# Patient Record
Sex: Female | Born: 1975 | Race: White | Hispanic: No | Marital: Married | Smoking: Never smoker
Health system: Southern US, Community
[De-identification: ages and names within clinical notes are randomized; demographics above are authoritative.]

## PROBLEM LIST (undated history)

## (undated) DIAGNOSIS — O2620 Pregnancy care for patient with recurrent pregnancy loss, unspecified trimester: Secondary | ICD-10-CM

## (undated) DIAGNOSIS — O039 Complete or unspecified spontaneous abortion without complication: Secondary | ICD-10-CM

## (undated) DIAGNOSIS — N809 Endometriosis, unspecified: Secondary | ICD-10-CM

## (undated) DIAGNOSIS — O009 Unspecified ectopic pregnancy without intrauterine pregnancy: Secondary | ICD-10-CM

## (undated) DIAGNOSIS — N979 Female infertility, unspecified: Secondary | ICD-10-CM

## (undated) HISTORY — DX: Endometriosis, unspecified: N80.9

## (undated) HISTORY — DX: Female infertility, unspecified: N97.9

## (undated) HISTORY — DX: Complete or unspecified spontaneous abortion without complication: O03.9

## (undated) HISTORY — DX: Unspecified ectopic pregnancy without intrauterine pregnancy: O00.90

## (undated) HISTORY — PX: WISDOM TOOTH EXTRACTION: SHX21

## (undated) HISTORY — DX: Pregnancy care for patient with recurrent pregnancy loss, unspecified trimester: O26.20

---

## 2005-09-14 ENCOUNTER — Emergency Department: Payer: Self-pay | Admitting: Emergency Medicine

## 2013-01-27 ENCOUNTER — Encounter: Payer: Self-pay | Admitting: *Deleted

## 2013-01-27 ENCOUNTER — Encounter: Payer: Self-pay | Admitting: Internal Medicine

## 2013-01-27 ENCOUNTER — Ambulatory Visit (INDEPENDENT_AMBULATORY_CARE_PROVIDER_SITE_OTHER): Payer: 59 | Admitting: Internal Medicine

## 2013-01-27 VITALS — BP 130/96 | HR 75 | Temp 98.7°F | Ht 65.0 in | Wt 170.0 lb

## 2013-01-27 DIAGNOSIS — Z319 Encounter for procreative management, unspecified: Secondary | ICD-10-CM | POA: Insufficient documentation

## 2013-01-27 DIAGNOSIS — Z0000 Encounter for general adult medical examination without abnormal findings: Secondary | ICD-10-CM

## 2013-01-27 LAB — CBC WITH DIFFERENTIAL/PLATELET
Basophils Relative: 1.1 % (ref 0.0–3.0)
Eosinophils Relative: 1.7 % (ref 0.0–5.0)
HCT: 41.8 % (ref 36.0–46.0)
Lymphs Abs: 1.4 10*3/uL (ref 0.7–4.0)
MCV: 85.3 fl (ref 78.0–100.0)
Monocytes Absolute: 0.5 10*3/uL (ref 0.1–1.0)
Platelets: 231 10*3/uL (ref 150.0–400.0)
RBC: 4.9 Mil/uL (ref 3.87–5.11)
WBC: 5.9 10*3/uL (ref 4.5–10.5)

## 2013-01-27 LAB — LIPID PANEL: Cholesterol: 175 mg/dL (ref 0–200)

## 2013-01-27 LAB — COMPREHENSIVE METABOLIC PANEL
Albumin: 4.1 g/dL (ref 3.5–5.2)
CO2: 23 mEq/L (ref 19–32)
GFR: 87.97 mL/min (ref >60.00)
Glucose, Bld: 94 mg/dL (ref 70–99)
Potassium: 4.2 mEq/L (ref 3.5–5.1)
Sodium: 138 mEq/L (ref 135–145)
Total Protein: 6.7 g/dL (ref 6.0–8.3)

## 2013-01-27 NOTE — Assessment & Plan Note (Signed)
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 2x pregnancy, resulting in miscarriage. Discussed common causes of infertility. Less likely to be ovulation disorder given regular menses and pregnancy x 2.  No symptoms to suggest endometriosis. Discussed female causes of infertility and testing. Will check general labs today including CBC, CMP, TSH, FSH/LH. Encouraged continued efforts at healthy diet and regular physical activity. Continue avoidance of excessive caffeine and alcohol. Will set up referral to [facility]. Follow up here in 3 months and prn.

## 2013-01-27 NOTE — Progress Notes (Signed)
  Subjective:    Patient ID: Jamie Gordon, female    DOB: [birth year], 37 y.o.   MRN: [id number]  HPI 37YO female presents to establish care. Generally healthy with no chronic illnesses. Concerned today about infertility x2 years. Regular menses, typically ever 26 days. No abdominal or pelvic pain, no menorrhagia or cramping. No h/o PID, pelvic surgery. 2x miscarriages both at 5 weeks. No family h/o fertility issues. Menstration started age 82.  Otherwise feeling well. Active. Follows healthy diet. Non-smoker.   No outpatient encounter prescriptions on file as of 01/27/2013.   No facility-administered encounter medications on file as of 01/27/2013.   BP 130/96  Pulse 75  Temp(Src) 98.7 F (37.1 C) (Oral)  Ht 5\' 5"  (1.651 m)  Wt 170 lb (77.111 kg)  BMI 28.29 kg/m2  SpO2 99%  LMP 01/11/2013  Review of Systems  Constitutional: Negative for fever, chills, appetite change, fatigue and unexpected weight change.  HENT: Negative for ear pain, congestion, sore throat, trouble swallowing, neck pain, voice change and sinus pressure.   Eyes: Negative for visual disturbance.  Respiratory: Negative for cough, shortness of breath, wheezing and stridor.   Cardiovascular: Negative for chest pain, palpitations and leg swelling.  Gastrointestinal: Negative for nausea, vomiting, abdominal pain, diarrhea, constipation, blood in stool, abdominal distention and anal bleeding.  Genitourinary: Negative for dysuria and flank pain.  Musculoskeletal: Negative for myalgias, arthralgias and gait problem.  Skin: Negative for color change and rash.  Neurological: Negative for dizziness and headaches.  Hematological: Negative for adenopathy. Does not bruise/bleed easily.  Psychiatric/Behavioral: Negative for suicidal ideas, sleep disturbance and dysphoric mood. The patient is not nervous/anxious.        Objective:   Physical Exam  Constitutional: She is oriented to person, place, and time. She appears  well-developed and well-nourished. No distress.  HENT:  Head: Normocephalic and atraumatic.  Right Ear: External ear normal.  Left Ear: External ear normal.  Nose: Nose normal.  Mouth/Throat: Oropharynx is clear and moist. No oropharyngeal exudate.  Eyes: Conjunctivae are normal. Pupils are equal, round, and reactive to light. Right eye exhibits no discharge. Left eye exhibits no discharge. No scleral icterus.  Neck: Normal range of motion. Neck supple. No tracheal deviation present. No thyromegaly present.  Cardiovascular: Normal rate, regular rhythm, normal heart sounds and intact distal pulses.  Exam reveals no gallop and no friction rub.   No murmur heard. Pulmonary/Chest: Effort normal and breath sounds normal. No accessory muscle usage. Not tachypneic. No respiratory distress. She has no decreased breath sounds. She has no wheezes. She has no rhonchi. She has no rales. She exhibits no tenderness.  Abdominal: Soft. Bowel sounds are normal. She exhibits no distension and no mass. There is no tenderness. There is no rebound and no guarding.  Musculoskeletal: Normal range of motion. She exhibits no edema and no tenderness.  Lymphadenopathy:    She has no cervical adenopathy.  Neurological: She is alert and oriented to person, place, and time. No cranial nerve deficit. She exhibits normal muscle tone. Coordination normal.  Skin: Skin is warm and dry. No rash noted. She is not diaphoretic. No erythema. No pallor.  Psychiatric: She has a normal mood and affect. Her behavior is normal. Judgment and thought content normal.          Assessment & Plan:

## 2013-02-15 DIAGNOSIS — N96 Recurrent pregnancy loss: Secondary | ICD-10-CM | POA: Insufficient documentation

## 2013-02-19 HISTORY — PX: ECTOPIC PREGNANCY SURGERY: SHX613

## 2013-02-21 ENCOUNTER — Ambulatory Visit: Payer: Self-pay | Admitting: General Practice

## 2013-02-21 ENCOUNTER — Observation Stay: Payer: Self-pay | Admitting: Obstetrics and Gynecology

## 2013-02-21 LAB — CBC WITH DIFFERENTIAL/PLATELET
Basophil #: 0.1 10*3/uL (ref 0.0–0.1)
Basophil %: 1.4 %
Eosinophil %: 1.5 %
HCT: 36.7 % (ref 35.0–47.0)
HGB: 12.8 g/dL (ref 12.0–16.0)
Lymphocyte #: 1.9 10*3/uL (ref 1.0–3.6)
Lymphocyte %: 25.1 %
MCH: 29.5 pg (ref 26.0–34.0)
MCV: 85 fL (ref 80–100)
Monocyte %: 7.6 %
Neutrophil #: 4.9 10*3/uL (ref 1.4–6.5)
RBC: 4.34 10*6/uL (ref 3.80–5.20)
RDW: 12.6 % (ref 11.5–14.5)
WBC: 7.6 10*3/uL (ref 3.6–11.0)

## 2013-02-21 LAB — COMPREHENSIVE METABOLIC PANEL
Albumin: 3.9 g/dL (ref 3.4–5.0)
Alkaline Phosphatase: 76 U/L (ref 50–136)
Anion Gap: 6 — ABNORMAL LOW (ref 7–16)
Bilirubin,Total: 0.4 mg/dL (ref 0.2–1.0)
Calcium, Total: 8.6 mg/dL (ref 8.5–10.1)
Co2: 25 mmol/L (ref 21–32)
Creatinine: 0.68 mg/dL (ref 0.60–1.30)
EGFR (African American): >60
EGFR (Non-African Amer.): >60
Osmolality: 269 (ref 275–301)
Potassium: 3.2 mmol/L — ABNORMAL LOW (ref 3.5–5.1)
SGOT(AST): 23 U/L (ref 15–37)
Sodium: 134 mmol/L — ABNORMAL LOW (ref 136–145)
Total Protein: 7 g/dL (ref 6.4–8.2)

## 2013-02-21 LAB — HCG, QUANTITATIVE, PREGNANCY: Beta Hcg, Quant.: 1716 m[IU]/mL — ABNORMAL HIGH

## 2013-02-23 LAB — PATHOLOGY REPORT

## 2013-07-04 ENCOUNTER — Encounter: Payer: Self-pay | Admitting: Internal Medicine

## 2013-07-04 ENCOUNTER — Ambulatory Visit (INDEPENDENT_AMBULATORY_CARE_PROVIDER_SITE_OTHER): Payer: 59 | Admitting: Internal Medicine

## 2013-07-04 VITALS — BP 118/78 | HR 74 | Temp 98.8°F | Wt 183.0 lb

## 2013-07-04 DIAGNOSIS — F4323 Adjustment disorder with mixed anxiety and depressed mood: Secondary | ICD-10-CM

## 2013-07-04 MED ORDER — FLUOXETINE HCL 20 MG PO TABS: 20.0000 mg | ORAL_TABLET | Freq: Every day | ORAL | Status: DC

## 2013-07-04 NOTE — Progress Notes (Signed)
  Subjective:    Patient ID: Jamie Gordon, female    DOB: Oct 07, 1975, 37 y.o.   MRN: [id number]  HPI 37 year old female presents for acute visit complaining of worsening anxiety. Earlier this year, she became pregnant after some difficulty with infertility. However, the pregnancy was an ectopic pregnancy and she required emergency surgery to repair her fallopian tube. She is tearful describing this. She is actively trying to get pregnant again. She reports she has been tearful most days. Friends and family members have encouraged her to get help. No suicidal ideation. She is working full-time and reports good support from coworkers and family members.  Outpatient Encounter Prescriptions as of 07/04/2013  Medication Sig Dispense Refill   No facility-administered encounter medications on file as of 07/04/2013.   BP 118/78  Pulse 74  Temp(Src) 98.8 F (37.1 C) (Oral)  Wt 183 lb (83.008 kg)  BMI 30.45 kg/m2  SpO2 96%  Review of Systems  Constitutional: Negative for fever, chills, appetite change, fatigue and unexpected weight change.  HENT: Negative for congestion, ear pain, sinus pressure, sore throat, trouble swallowing and voice change.   Eyes: Negative for visual disturbance.  Respiratory: Negative for cough, shortness of breath, wheezing and stridor.   Cardiovascular: Negative for chest pain, palpitations and leg swelling.  Gastrointestinal: Negative for nausea, vomiting, abdominal pain, diarrhea, constipation, blood in stool, abdominal distention and anal bleeding.  Genitourinary: Negative for dysuria and flank pain.  Musculoskeletal: Negative for arthralgias, gait problem, myalgias and neck pain.  Skin: Negative for color change and rash.  Neurological: Negative for dizziness and headaches.  Hematological: Negative for adenopathy. Does not bruise/bleed easily.  Psychiatric/Behavioral: Positive for dysphoric mood. Negative for suicidal ideas and sleep disturbance. The patient is  nervous/anxious.        Objective:   Physical Exam  Constitutional: She is oriented to person, place, and time. She appears well-developed and well-nourished. No distress.  HENT:  Head: Normocephalic and atraumatic.  Right Ear: External ear normal.  Left Ear: External ear normal.  Nose: Nose normal.  Mouth/Throat: Oropharynx is clear and moist. No oropharyngeal exudate.  Eyes: Conjunctivae are normal. Pupils are equal, round, and reactive to light. Right eye exhibits no discharge. Left eye exhibits no discharge. No scleral icterus.  Neck: Normal range of motion. Neck supple. No tracheal deviation present. No thyromegaly present.  Cardiovascular: Normal rate, regular rhythm, normal heart sounds and intact distal pulses.  Exam reveals no gallop and no friction rub.   No murmur heard. Pulmonary/Chest: Effort normal and breath sounds normal. No accessory muscle usage. Not tachypneic. No respiratory distress. She has no decreased breath sounds. She has no wheezes. She has no rhonchi. She has no rales. She exhibits no tenderness.  Musculoskeletal: Normal range of motion. She exhibits no edema and no tenderness.  Lymphadenopathy:    She has no cervical adenopathy.  Neurological: She is alert and oriented to person, place, and time. No cranial nerve deficit. She exhibits normal muscle tone. Coordination normal.  Skin: Skin is warm and dry. No rash noted. She is not diaphoretic. No erythema. No pallor.  Psychiatric: Her behavior is normal. Judgment and thought content normal. Her mood appears anxious. She exhibits a depressed mood.          Assessment & Plan:

## 2013-07-04 NOTE — Assessment & Plan Note (Addendum)
Symptoms are consistent with adjustment disorder with mixed anxiety and depressed mood. Will start fluoxetine. Discussed setting up counseling however patient would like to hold off for now. Patient will followup in 2-4 weeks or sooner as needed. Over of which >50% spent in face-to-face contact with patient discussing plan of care

## 2013-07-25 ENCOUNTER — Ambulatory Visit: Payer: 59 | Admitting: Internal Medicine

## 2013-07-27 ENCOUNTER — Other Ambulatory Visit: Payer: Self-pay

## 2013-09-12 ENCOUNTER — Ambulatory Visit (INDEPENDENT_AMBULATORY_CARE_PROVIDER_SITE_OTHER): Payer: 59 | Admitting: Internal Medicine

## 2013-09-12 ENCOUNTER — Encounter: Payer: Self-pay | Admitting: Internal Medicine

## 2013-09-12 VITALS — BP 114/80 | HR 77 | Temp 98.2°F | Wt 183.0 lb

## 2013-09-12 DIAGNOSIS — F4323 Adjustment disorder with mixed anxiety and depressed mood: Secondary | ICD-10-CM

## 2013-09-12 MED ORDER — FLUOXETINE HCL 20 MG PO TABS: 20.0000 mg | ORAL_TABLET | Freq: Every day | ORAL | Status: DC

## 2013-09-12 NOTE — Progress Notes (Signed)
   Subjective:    Patient ID: Jamie Gordon, female    DOB: 13-Jun-1976, 37 y.o.   MRN: [id number]  HPI 37YO female with h/o depression presents for follow up. Doing very well. Tolerating Fluoxetine without any known side effects. No further symptoms of depressed mood.  Outpatient Encounter Prescriptions as of 09/12/2013  Medication Sig  . FLUoxetine (PROZAC) 20 MG tablet Take 1 tablet (20 mg total) by mouth daily.   BP 114/80  Pulse 77  Temp(Src) 98.2 F (36.8 C) (Oral)  Wt 183 lb (83.008 kg)  SpO2 98%  Review of Systems  Constitutional: Negative for fever, chills, appetite change, fatigue and unexpected weight change.  HENT: Negative for congestion, ear pain, sinus pressure, sore throat, trouble swallowing and voice change.   Eyes: Negative for visual disturbance.  Respiratory: Negative for cough, shortness of breath, wheezing and stridor.   Cardiovascular: Negative for chest pain, palpitations and leg swelling.  Gastrointestinal: Negative for nausea, vomiting, abdominal pain, diarrhea, constipation, blood in stool, abdominal distention and anal bleeding.  Genitourinary: Negative for dysuria and flank pain.  Musculoskeletal: Negative for arthralgias, gait problem, myalgias and neck pain.  Skin: Negative for color change and rash.  Neurological: Negative for dizziness and headaches.  Hematological: Negative for adenopathy. Does not bruise/bleed easily.  Psychiatric/Behavioral: Negative for suicidal ideas, sleep disturbance and dysphoric mood. The patient is not nervous/anxious.        Objective:   Physical Exam  Constitutional: She is oriented to person, place, and time. She appears well-developed and well-nourished. No distress.  HENT:  Head: Normocephalic and atraumatic.  Right Ear: External ear normal.  Left Ear: External ear normal.  Nose: Nose normal.  Mouth/Throat: Oropharynx is clear and moist. No oropharyngeal exudate.  Eyes: Conjunctivae are normal. Pupils are  equal, round, and reactive to light. Right eye exhibits no discharge. Left eye exhibits no discharge. No scleral icterus.  Neck: Normal range of motion. Neck supple. No tracheal deviation present. No thyromegaly present.  Cardiovascular: Normal rate, regular rhythm, normal heart sounds and intact distal pulses.  Exam reveals no gallop and no friction rub.   No murmur heard. Pulmonary/Chest: Effort normal and breath sounds normal. No accessory muscle usage. Not tachypneic. No respiratory distress. She has no decreased breath sounds. She has no wheezes. She has no rhonchi. She has no rales. She exhibits no tenderness.  Musculoskeletal: Normal range of motion. She exhibits no edema and no tenderness.  Lymphadenopathy:    She has no cervical adenopathy.  Neurological: She is alert and oriented to person, place, and time. No cranial nerve deficit. She exhibits normal muscle tone. Coordination normal.  Skin: Skin is warm and dry. No rash noted. She is not diaphoretic. No erythema. No pallor.  Psychiatric: She has a normal mood and affect. Her behavior is normal. Judgment and thought content normal.          Assessment & Plan:

## 2013-09-12 NOTE — Progress Notes (Signed)
Pre-visit discussion using our clinic review tool. No additional management support is needed unless otherwise documented below in the visit note.  

## 2013-09-12 NOTE — Assessment & Plan Note (Signed)
Symptoms improved with Fluoxetine. No side effects noted. Will continue medication. Follow up 6 months and prn.

## 2014-03-15 ENCOUNTER — Encounter: Payer: 59 | Admitting: Internal Medicine

## 2014-04-25 ENCOUNTER — Ambulatory Visit (INDEPENDENT_AMBULATORY_CARE_PROVIDER_SITE_OTHER): Payer: 59 | Admitting: Internal Medicine

## 2014-04-25 ENCOUNTER — Encounter: Payer: Self-pay | Admitting: Internal Medicine

## 2014-04-25 VITALS — BP 110/80 | HR 87 | Temp 98.4°F | Ht 65.0 in | Wt 185.0 lb

## 2014-04-25 DIAGNOSIS — Z0000 Encounter for general adult medical examination without abnormal findings: Secondary | ICD-10-CM

## 2014-04-25 DIAGNOSIS — E669 Obesity, unspecified: Secondary | ICD-10-CM

## 2014-04-25 DIAGNOSIS — F4323 Adjustment disorder with mixed anxiety and depressed mood: Secondary | ICD-10-CM

## 2014-04-25 LAB — COMPREHENSIVE METABOLIC PANEL
ALT: 12 U/L (ref 0–35)
AST: 17 U/L (ref 0–37)
Albumin: 3.6 g/dL (ref 3.5–5.2)
Alkaline Phosphatase: 78 U/L (ref 39–117)
BUN: 14 mg/dL (ref 6–23)
CO2: 25 meq/L (ref 19–32)
CREATININE: 0.8 mg/dL (ref 0.4–1.2)
Calcium: 8.6 mg/dL (ref 8.4–10.5)
Chloride: 108 mEq/L (ref 96–112)
GFR: 89.97 mL/min (ref >60.00)
GLUCOSE: 96 mg/dL (ref 70–99)
Potassium: 3.6 mEq/L (ref 3.5–5.1)
Sodium: 138 mEq/L (ref 135–145)
Total Bilirubin: 0.5 mg/dL (ref 0.2–1.2)
Total Protein: 6.6 g/dL (ref 6.0–8.3)

## 2014-04-25 LAB — LIPID PANEL
CHOLESTEROL: 167 mg/dL (ref 0–200)
HDL: 36.4 mg/dL — AB (ref >39.00)
LDL Cholesterol: 108 mg/dL — ABNORMAL HIGH (ref 0–99)
NonHDL: 130.6
Total CHOL/HDL Ratio: 5
Triglycerides: 114 mg/dL (ref 0.0–149.0)
VLDL: 22.8 mg/dL (ref 0.0–40.0)

## 2014-04-25 LAB — CBC WITH DIFFERENTIAL/PLATELET
BASOS ABS: 0 10*3/uL (ref 0.0–0.1)
Basophils Relative: 0.6 % (ref 0.0–3.0)
EOS ABS: 0.2 10*3/uL (ref 0.0–0.7)
Eosinophils Relative: 2.5 % (ref 0.0–5.0)
HCT: 39.6 % (ref 36.0–46.0)
HEMOGLOBIN: 13.1 g/dL (ref 12.0–15.0)
LYMPHS ABS: 1.8 10*3/uL (ref 0.7–4.0)
LYMPHS PCT: 25.1 % (ref 12.0–46.0)
MCHC: 33 g/dL (ref 30.0–36.0)
MCV: 86.6 fl (ref 78.0–100.0)
MONOS PCT: 6.9 % (ref 3.0–12.0)
Monocytes Absolute: 0.5 10*3/uL (ref 0.1–1.0)
NEUTROS ABS: 4.6 10*3/uL (ref 1.4–7.7)
Neutrophils Relative %: 64.9 % (ref 43.0–77.0)
PLATELETS: 237 10*3/uL (ref 150.0–400.0)
RBC: 4.57 Mil/uL (ref 3.87–5.11)
RDW: 12.6 % (ref 11.5–15.5)
WBC: 7.1 10*3/uL (ref 4.0–10.5)

## 2014-04-25 LAB — TSH: TSH: 2.18 u[IU]/mL (ref 0.35–4.50)

## 2014-04-25 LAB — VITAMIN D 25 HYDROXY (VIT D DEFICIENCY, FRACTURES): VITD: 44.64 ng/mL (ref 30.00–100.00)

## 2014-04-25 MED ORDER — FLUOXETINE HCL 20 MG PO TABS: 20.0000 mg | ORAL_TABLET | Freq: Every day | ORAL | Status: DC

## 2014-04-25 NOTE — Progress Notes (Signed)
Pre visit review using our clinic review tool, if applicable. No additional management support is needed unless otherwise documented below in the visit note. 

## 2014-04-25 NOTE — Assessment & Plan Note (Signed)
Wt Readings from Last 3 Encounters:  04/25/14 185 lb (83.915 kg)  09/12/13 183 lb (83.008 kg)  07/04/13 183 lb (83.008 kg)   Body mass index is 30.79 kg/(m^2). Encouraged Mediterranean style diet and regular exercise with goal of 40min 3x per week.

## 2014-04-25 NOTE — Assessment & Plan Note (Signed)
Symptoms well controlled with Fluoxetine. Will continue. 

## 2014-04-25 NOTE — Patient Instructions (Signed)

## 2014-04-25 NOTE — Assessment & Plan Note (Signed)
General medical exam normal today including breast exam. PAP and pelvic deferred as PAP normal 2013 and pt has follow up with GYN. Immunizations UTD. Will check labs today CBC, CMP, lipids, TSH.

## 2014-04-25 NOTE — Progress Notes (Signed)
Subjective:    Patient ID: Jamie Gordon, female    DOB: 1976/02/04, 38 y.o.   MRN: [id number]  HPI 38YO female presents for annual exam.  Started using Clomid to help with conception. Plans for 6 months. Having some temp intolerance with this.  Otherwise, feeling well. Trying to stay active and follow healthy diet.  Symptoms of depression well controlled on Fluoxetine.  Review of Systems  Constitutional: Negative for fever, chills, appetite change, fatigue and unexpected weight change.  Eyes: Negative for visual disturbance.  Respiratory: Negative for shortness of breath.   Cardiovascular: Negative for chest pain and leg swelling.  Gastrointestinal: Negative for nausea, vomiting, abdominal pain, diarrhea, constipation and abdominal distention.  Endocrine: Positive for heat intolerance.  Genitourinary: Negative for urgency, frequency and pelvic pain.  Musculoskeletal: Negative for arthralgias and myalgias.  Skin: Negative for color change and rash.  Hematological: Negative for adenopathy. Does not bruise/bleed easily.  Psychiatric/Behavioral: Negative for suicidal ideas and dysphoric mood. The patient is not nervous/anxious.        Objective:    BP 110/80  Pulse 87  Temp(Src) 98.4 F (36.9 C) (Oral)  Ht 5\' 5"  (1.651 m)  Wt 185 lb (83.915 kg)  BMI 30.79 kg/m2  SpO2 94%  LMP 04/10/2014 Physical Exam  Constitutional: She is oriented to person, place, and time. She appears well-developed and well-nourished. No distress.  HENT:  Head: Normocephalic and atraumatic.  Right Ear: External ear normal.  Left Ear: External ear normal.  Nose: Nose normal.  Mouth/Throat: Oropharynx is clear and moist. No oropharyngeal exudate.  Eyes: Conjunctivae are normal. Pupils are equal, round, and reactive to light. Right eye exhibits no discharge. Left eye exhibits no discharge. No scleral icterus.  Neck: Normal range of motion. Neck supple. No tracheal deviation present. No thyromegaly  present.  Cardiovascular: Normal rate, regular rhythm, normal heart sounds and intact distal pulses.  Exam reveals no gallop and no friction rub.   No murmur heard. Pulmonary/Chest: Effort normal and breath sounds normal. No accessory muscle usage. Not tachypneic. No respiratory distress. She has no decreased breath sounds. She has no wheezes. She has no rhonchi. She has no rales. She exhibits no tenderness. Right breast exhibits no inverted nipple, no mass, no nipple discharge, no skin change and no tenderness. Left breast exhibits no inverted nipple, no mass, no nipple discharge, no skin change and no tenderness. Breasts are symmetrical.  Abdominal: Soft. Bowel sounds are normal. She exhibits no distension and no mass. There is no tenderness. There is no rebound and no guarding.  Musculoskeletal: Normal range of motion. She exhibits no edema and no tenderness.  Lymphadenopathy:    She has no cervical adenopathy.  Neurological: She is alert and oriented to person, place, and time. No cranial nerve deficit. She exhibits normal muscle tone. Coordination normal.  Skin: Skin is warm and dry. No rash noted. She is not diaphoretic. No erythema. No pallor.  Psychiatric: She has a normal mood and affect. Her behavior is normal. Judgment and thought content normal.          Assessment & Plan:   Problem List Items Addressed This Visit     Unprioritized   Adjustment disorder with mixed anxiety and depressed mood     Symptoms well controlled with Fluoxetine. Will continue.    Relevant Medications      FLUoxetine (PROZAC) tablet   Obesity (BMI 30-39.9)      Wt Readings from Last 3 Encounters:  04/25/14 185  lb (83.915 kg)  09/12/13 183 lb (83.008 kg)  07/04/13 183 lb (83.008 kg)   Body mass index is 30.79 kg/(m^2). Encouraged Mediterranean style diet and regular exercise with goal of 3x per week.    Routine general medical examination at a health care facility - Primary     General  medical exam normal today including breast exam. PAP and pelvic deferred as PAP normal 2013 and pt has follow up with GYN. Immunizations UTD. Will check labs today CBC, CMP, lipids, TSH.    Relevant Orders      CBC with Differential      Comprehensive metabolic panel      Lipid panel      Vit D  25 hydroxy (rtn osteoporosis monitoring)      TSH       Return in about 1 year (around 04/26/2015) for Physical.

## 2015-01-11 NOTE — H&P (Signed)
PATIENT NAME:  Jamie Gordon, Jamie Gordon MR#:  [id number] DATE OF BIRTH:  06-Aug-1976  DATE OF ADMISSION:  02/21/2013  DIAGNOSIS:  Ectopic pregnancy.    HISTORY OF PRESENT ILLNESS: The patient is a 39 year old married white female gravida 3, para 0-0-2-0, last menstrual period April 28, who presents for surgical management of tubal pregnancy. The patient developed recent vaginal bleeding with subsequent ER work-up demonstrating quantitative hCG titer 1700, ultrasound showing an empty uterus and a left adnexal mass with a double ring sign, suspicious for tubal pregnancy. Options of management were reviewed and the patient desires to proceed with surgical intervention.   PAST MEDICAL HISTORY: Infertility.   PAST SURGICAL HISTORY: Wisdom teeth extraction.   PAST OB HISTORY:  Para 0-0-2-0 SAB x 2, none requiring dilation and curettage.   FAMILY HISTORY: Noncontributory.   SOCIAL HISTORY: The patient does not smoke, does not drink, does not use drugs.   CURRENT MEDICATIONS:  None.     DRUG ALLERGIES: None.   REVIEW OF SYSTEMS: Negative, except for a vaginal bleeding.   PHYSICAL EXAMINATION: VITAL SIGNS: Weight not taken, heart rate 88, respiratory rate 18, blood pressure 119/74, temperature 97.9.  GENERAL: The patient is a [place] well-appearing white female in no acute distress. She is alert and oriented. Affect is appropriate.  HEENT: Oropharynx clear.  NECK: Supple. No thyromegaly or adenopathy.   LUNGS: Clear.  HEART: Regular rate and rhythm without murmur.  ABDOMEN: Soft and nontender without organomegaly, no distention. No peritoneal signs.   PELVIC: Deferred to the OR.  EXTREMITIES: Warm and dry.  CONSENT NOTE:  The patient is to undergo laparoscopic excision of ectopic pregnancy. She is understanding of the planned procedure and is aware of and is accepting of all surgical risks which include but are not limited to bleeding, infection, pelvic, organ injury with need for repair, blood  clot disorders, anesthesia risks, etc. All of her questions have been answered. Informed consent is given. The patient is ready and willing to proceed with surgery as scheduled. The patient was given the option of methotrexate therapy but declined. The patient understands that during the surgical procedure we will for evidence of infertility during her laparoscopy.   LABORATORY DATA:  Serum creatinine 0.68 quantitative hCG of 1716, LFTs normal, white blood cell count 7.6, hemoglobin 12.8, hematocrit 36.7, platelets 248,000. A+ blood type, antibody screen negative    ____________________________ Prentice DockerMartin A. Cara Thaxton, MD mad:cc D: 02/21/2013 19:49:20 ET T: 02/21/2013 20:02:16 ET JOB#: [id number]  cc: Daphine DeutscherMartin A. Tlaloc Taddei, MD, <Dictator> Prentice DockerMARTIN A Mailynn Everly MD ELECTRONICALLY SIGNED 02/28/2013 15:05

## 2015-01-11 NOTE — Op Note (Signed)
PATIENT NAME:  Jamie Gordon, Jamie Gordon MR#:  [id number] DATE OF BIRTH:  November 10, 1975  DATE OF PROCEDURE:  02/21/2013  PREOPERATIVE DIAGNOSES:  Left tubal pregnancy, unruptured.   POSTOPERATIVE DIAGNOSES:  Left tubal pregnancy, unruptured.   OPERATIVE PROCEDURE: Operative laparoscopy with left  partial salpingectomy.   SURGEON: Herold HarmsMartin A Sharren Schnurr, Gordon.D.   FIRST ASSISTANT: Jules SchickBrittany James, PA-S.   ANESTHESIA: General endotracheal.   INDICATIONS: The patient is a 39 year old white female gravida 3, para 2-0-0-2 in first trimester, who presents with suspected ectopic pregnancy. The patient had vaginal bleeding and suboptimal hCG rise with subsequent ultrasound demonstrating a left tubal pregnancy. There was a small amount of fluid in the cul-de-sac as well.   FINDINGS AT SURGERY: Revealed a day 2 x 1 cm left tubal pregnancy. There was a 25 mL hemoperitoneum. There was evidence of possible endometriosis in the cul-de-sac demonstrated by pseudofenestrations as well as some brown, reddish implants, possibly consistent with endometriosis. The uterus, bilateral ovaries and right fallopian tube were normal. Appendix was normal. Upper abdomen was normal.   DESCRIPTION OF PROCEDURE: The patient was brought to the operating room where she was placed in the supine position. General endotracheal anesthesia was induced without difficulty. She was placed in dorsal lithotomy position using the bumblebee stirrups. A ChloraPrep and Betadine abdominal, perineal, intravaginal prep and drape was performed in the standard fashion. A red Robinson catheter was used to drain 100 mL of urine from the bladder. A Hulka tenaculum was placed on the cervix to facilitate uterine manipulation. Subumbilical vertical incision of 5 mm in length was made. The Optiview laparoscopic trocar system was used to place a 5 mm scope directly into the abdominal pelvic cavity without evidence of bowel or vascular injury. Pneumoperitoneum was created with  CO2 gas. A 5 mm port was placed in the suprapubic region and an 11 mm port was placed in the left lower quadrant position under direct visualization. The above findings were photo documented. The left partial salpingectomy was performed using the Ace Harmonic scalpel with removal of the distal portion of the fallopian tube incorporating the ectopic as well as paratubal cyst. This was removed the abdomen through the 11 mm port. The pelvis was copiously irrigated. The irrigant fluid was aspirated. Photo documentation of the findings were made. The procedure was then terminated with all instrumentation being removed from the abdomen. Pneumoperitoneum was released. The incisions were closed with 0 Vicryl on the fascia and 4-0 Vicryl on the subcuticular stitch on the 5 mm ports. Dermabond glue was placed over the incisions. The patient was then awakened, extubated, and taken to the recovery room in satisfactory condition.   ESTIMATED BLOOD LOSS: Minimal.   COMPLICATIONS: None.   All instruments, needle, and sponge counts were verified as correct.   ____________________________ Prentice DockerMartin A. Jaquavious Mercer, MD mad:cc D: 02/21/2013 22:19:19 ET T: 02/21/2013 23:05:59 ET JOB#: [id number]  cc: Daphine DeutscherMartin A. Gizell Danser, MD, <Dictator> [facility] Prentice DockerMARTIN A Bassam Dresch MD ELECTRONICALLY SIGNED 02/28/2013 15:05

## 2015-04-29 ENCOUNTER — Encounter: Payer: 59 | Admitting: Internal Medicine

## 2015-06-26 ENCOUNTER — Ambulatory Visit (INDEPENDENT_AMBULATORY_CARE_PROVIDER_SITE_OTHER): Payer: 59 | Admitting: Obstetrics and Gynecology

## 2015-06-26 ENCOUNTER — Encounter: Payer: Self-pay | Admitting: Obstetrics and Gynecology

## 2015-06-26 VITALS — BP 128/78 | HR 80 | Ht 64.0 in | Wt 188.9 lb

## 2015-06-26 DIAGNOSIS — N979 Female infertility, unspecified: Secondary | ICD-10-CM | POA: Diagnosis not present

## 2015-06-26 NOTE — Progress Notes (Signed)
Chief complaint: 1.   Secondary infertility.  The patient is a 39 year old married white female, para 16, with long history of secondary infertility.  In 2015 the patient underwent 6 cycles of Clomid 50 mg ovulation Induction, days 5 through 9, with cycle interval of 33 days, without success, despite day 22 serum progesterone levels demonstrating ovulation (37.8); All UPT's were negative.  02/21/2013  Tubal pregnancy was treated with left partial Salpingectomy;Pathology: ectopic pregnancy and portion of left tube. Patient does have possible endometriosis.  Laparoscopy demonstrated cul-de-sac Pseudo-fenestrations and brown and red implants (not biopsied)  11/02/2013 infertility workup at [facility] for habitual abortion history: Husband chromosomal analysis-46 XY. Patient chromosomal analysis-46XX Lupus anticoagulant testing negative. Anti-cardiolipin antibody testing negative. Prolactin normal. Hemoglobin A1c 5.0. Antithyroglobulin antibody negative. Anti-thyroid microsomal antibody negative. Antimicrosomal antibody negative. Anti-glycoprotein, beta 2 antibody negative. Antimalarial hormone negative  No recent semen analysis.  Past Medical History  Diagnosis Date  . Female fertility problem   . Miscarriage     x2 at 5 weeks  . Habitual abortion history, antepartum   . Ectopic pregnancy     s/p left partial salpingectomy  . Endometriosis    Past Surgical History  Procedure Laterality Date  . Ectopic pregnancy surgery  02/2013    Dr. Greggory Keen  . Wisdom tooth extraction      FAMILY HISTORY: Ovarian cancer-maternal grandmother. No history of colon cancer, breast cancer, heart disease, or diabetes  SOCIAL HISTORY: Never smoker. No alcohol use. No drug use history.  ASSESSMENT: 1.  Secondary infertility. 2.  No success with Clomid ovulation induction. 3.  History of left tubal pregnancy, status post left partial salpingectomy. 4.  Possible endometriosis. 5.   Advanced maternal age.  PLAN: 1.  Referral to [facility], Dr. Olin Hauser  A total of 15 minutes were spent face-to-face with the patient during this encounter and over half of that time dealt with counseling and coordination of care.  Herold Harms, MD

## 2015-10-04 DIAGNOSIS — Z319 Encounter for procreative management, unspecified: Secondary | ICD-10-CM | POA: Diagnosis not present

## 2015-11-28 DIAGNOSIS — Z319 Encounter for procreative management, unspecified: Secondary | ICD-10-CM | POA: Diagnosis not present

## 2016-08-12 ENCOUNTER — Ambulatory Visit: Payer: Self-pay | Admitting: Physician Assistant

## 2016-08-12 DIAGNOSIS — R319 Hematuria, unspecified: Principal | ICD-10-CM

## 2016-08-12 DIAGNOSIS — N390 Urinary tract infection, site not specified: Secondary | ICD-10-CM

## 2016-08-12 LAB — POCT URINALYSIS DIPSTICK
Bilirubin, UA: NEGATIVE
GLUCOSE UA: NEGATIVE
Ketones, UA: NEGATIVE
NITRITE UA: NEGATIVE
PH UA: 5.5
Spec Grav, UA: >=1.03
UROBILINOGEN UA: 0.2

## 2016-08-12 MED ORDER — PHENAZOPYRIDINE HCL 200 MG PO TABS
200.0000 mg | ORAL_TABLET | Freq: Three times a day (TID) | ORAL | 0 refills | Status: DC | PRN
Start: 2016-08-12 — End: 2016-12-16

## 2016-08-12 MED ORDER — NITROFURANTOIN MONOHYD MACRO 100 MG PO CAPS: 100.0000 mg | ORAL_CAPSULE | Freq: Two times a day (BID) | ORAL | 0 refills | Status: DC

## 2016-08-12 NOTE — Progress Notes (Signed)
S:  C/o uti sx for 1 days, burning, urgency, frequency, denies vaginal discharge, abdominal pain or flank pain:  Remainder ros neg  O:  Afebrile, nad, neuro intact, ua trace leuks, trace blood  A: uti  P: macrobid 100mg  bid x 7d, pyridium, increase water intake, add cranberry juice, return if not improving in 2 -3 days, return earlier if worsening, discussed pyelonephritis sx

## 2016-12-16 ENCOUNTER — Encounter: Payer: Self-pay | Admitting: Family

## 2016-12-16 ENCOUNTER — Other Ambulatory Visit (HOSPITAL_COMMUNITY)
Admission: RE | Admit: 2016-12-16 | Discharge: 2016-12-16 | Disposition: A | Discharge status: Home / Self Care | Payer: 59 | Source: Ambulatory Visit | Attending: Family | Admitting: Family

## 2016-12-16 ENCOUNTER — Ambulatory Visit (INDEPENDENT_AMBULATORY_CARE_PROVIDER_SITE_OTHER): Payer: 59 | Admitting: Family

## 2016-12-16 VITALS — BP 118/72 | HR 91 | Temp 98.3°F | Ht 64.0 in | Wt 190.2 lb

## 2016-12-16 DIAGNOSIS — Z1151 Encounter for screening for human papillomavirus (HPV): Secondary | ICD-10-CM | POA: Insufficient documentation

## 2016-12-16 DIAGNOSIS — F4323 Adjustment disorder with mixed anxiety and depressed mood: Secondary | ICD-10-CM

## 2016-12-16 DIAGNOSIS — Z0000 Encounter for general adult medical examination without abnormal findings: Secondary | ICD-10-CM | POA: Diagnosis not present

## 2016-12-16 DIAGNOSIS — Z01419 Encounter for gynecological examination (general) (routine) without abnormal findings: Secondary | ICD-10-CM | POA: Insufficient documentation

## 2016-12-16 MED ORDER — FLUOXETINE HCL 20 MG PO TABS: 20.0000 mg | ORAL_TABLET | Freq: Every day | ORAL | 1 refills | Status: DC

## 2016-12-16 NOTE — Patient Instructions (Addendum)
Start prozac.  As discussed, prozac is presumed to be one of the safer medications in pregnancy however no SSRI is supported by large scale research . Caution is advised with this medication in pregnancy; especially  in 3rd trimester.  Our hope is for gradual improvement of mood since starting medication; however this may take several weeks.   If you start to have unusual thoughts, thoughts of hurting yourself, or anyone else, please go immediately to the emergency department.   Follow up 3-6 months.    National Suicide Prevention Hotline - available 24 hours a day, 7 days a week.  (475)087-2957     Health Maintenance, Female Adopting a healthy lifestyle and getting preventive care can go a long way to promote health and wellness. Talk with your health care provider about what schedule of regular examinations is right for you. This is a good chance for you to check in with your provider about disease prevention and staying healthy. In between checkups, there are plenty of things you can do on your own. Experts have done a lot of research about which lifestyle changes and preventive measures are most likely to keep you healthy. Ask your health care provider for more information. Weight and diet Eat a healthy diet  Be sure to include plenty of vegetables, fruits, low-fat dairy products, and lean protein.  Do not eat a lot of foods high in solid fats, added sugars, or salt.  Get regular exercise. This is one of the most important things you can do for your health.  Most adults should exercise for at least 150 minutes each week. The exercise should increase your heart rate and make you sweat (moderate-intensity exercise).  Most adults should also do strengthening exercises at least twice a week. This is in addition to the moderate-intensity exercise. Maintain a healthy weight  Body mass index (BMI) is a measurement that can be used to identify possible weight problems. It estimates  body fat based on height and weight. Your health care provider can help determine your BMI and help you achieve or maintain a healthy weight.  For females 3 years of age and older:  A BMI below 18.5 is considered underweight.  A BMI of 18.5 to 24.9 is normal.  A BMI of 25 to 29.9 is considered overweight.  A BMI of 30 and above is considered obese. Watch levels of cholesterol and blood lipids  You should start having your blood tested for lipids and cholesterol at 39 years of age, then have this test every 5 years.  You may need to have your cholesterol levels checked more often if:  Your lipid or cholesterol levels are high.  You are older than 39 years of age.  You are at high risk for heart disease. Cancer screening Lung Cancer  Lung cancer screening is recommended for adults 62-70 years old who are at high risk for lung cancer because of a history of smoking.  A yearly low-dose CT scan of the lungs is recommended for people who:  Currently smoke.  Have quit within the past 15 years.  Have at least a 30-pack-year history of smoking. A pack year is smoking an average of one pack of cigarettes a day for 1 year.  Yearly screening should continue until it has been 15 years since you quit.  Yearly screening should stop if you develop a health problem that would prevent you from having lung cancer treatment. Breast Cancer  Practice breast self-awareness. This means understanding how  your breasts normally appear and feel.  It also means doing regular breast self-exams. Let your health care provider know about any changes, no matter how small.  If you are in your 20s or 30s, you should have a clinical breast exam (CBE) by a health care provider every 1-3 years as part of a regular health exam.  If you are 68 or older, have a CBE every year. Also consider having a breast X-ray (mammogram) every year.  If you have a family history of breast cancer, talk to your health care  provider about genetic screening.  If you are at high risk for breast cancer, talk to your health care provider about having an MRI and a mammogram every year.  Breast cancer gene (BRCA) assessment is recommended for women who have family members with BRCA-related cancers. BRCA-related cancers include:  Breast.  Ovarian.  Tubal.  Peritoneal cancers.  Results of the assessment will determine the need for genetic counseling and BRCA1 and BRCA2 testing. Cervical Cancer  Your health care provider may recommend that you be screened regularly for cancer of the pelvic organs (ovaries, uterus, and vagina). This screening involves a pelvic examination, including checking for microscopic changes to the surface of your cervix (Pap test). You may be encouraged to have this screening done every 3 years, beginning at age 68.  For women ages 85-65, health care providers may recommend pelvic exams and Pap testing every 3 years, or they may recommend the Pap and pelvic exam, combined with testing for human papilloma virus (HPV), every 5 years. Some types of HPV increase your risk of cervical cancer. Testing for HPV may also be done on women of any age with unclear Pap test results.  Other health care providers may not recommend any screening for nonpregnant women who are considered low risk for pelvic cancer and who do not have symptoms. Ask your health care provider if a screening pelvic exam is right for you.  If you have had past treatment for cervical cancer or a condition that could lead to cancer, you need Pap tests and screening for cancer for at least 20 years after your treatment. If Pap tests have been discontinued, your risk factors (such as having a new sexual partner) need to be reassessed to determine if screening should resume. Some women have medical problems that increase the chance of getting cervical cancer. In these cases, your health care provider may recommend more frequent screening and  Pap tests. Colorectal Cancer  This type of cancer can be detected and often prevented.  Routine colorectal cancer screening usually begins at 39 years of age and continues through 39 years of age.  Your health care provider may recommend screening at an earlier age if you have risk factors for colon cancer.  Your health care provider may also recommend using home test kits to check for hidden blood in the stool.  A small camera at the end of a tube can be used to examine your colon directly (sigmoidoscopy or colonoscopy). This is done to check for the earliest forms of colorectal cancer.  Routine screening usually begins at age 51.  Direct examination of the colon should be repeated every 5-10 years through 39 years of age. However, you may need to be screened more often if early forms of precancerous polyps or small growths are found. Skin Cancer  Check your skin from head to toe regularly.  Tell your health care provider about any new moles or changes in moles,  especially if there is a change in a mole's shape or color.  Also tell your health care provider if you have a mole that is larger than the size of a pencil eraser.  Always use sunscreen. Apply sunscreen liberally and repeatedly throughout the day.  Protect yourself by wearing long sleeves, pants, a wide-brimmed hat, and sunglasses whenever you are outside. Heart disease, diabetes, and high blood pressure  High blood pressure causes heart disease and increases the risk of stroke. High blood pressure is more likely to develop in:  People who have blood pressure in the high end of the normal range (130-139/85-89 mm Hg).  People who are overweight or obese.  People who are African American.  If you are 29-50 years of age, have your blood pressure checked every 3-5 years. If you are 59 years of age or older, have your blood pressure checked every year. You should have your blood pressure measured twice-once when you are at a  hospital or clinic, and once when you are not at a hospital or clinic. Record the average of the two measurements. To check your blood pressure when you are not at a hospital or clinic, you can use:  An automated blood pressure machine at a pharmacy.  A home blood pressure monitor.  If you are between 67 years and 8 years old, ask your health care provider if you should take aspirin to prevent strokes.  Have regular diabetes screenings. This involves taking a blood sample to check your fasting blood sugar level.  If you are at a normal weight and have a low risk for diabetes, have this test once every three years after 39 years of age.  If you are overweight and have a high risk for diabetes, consider being tested at a younger age or more often. Preventing infection Hepatitis B  If you have a higher risk for hepatitis B, you should be screened for this virus. You are considered at high risk for hepatitis B if:  You were born in a country where hepatitis B is common. Ask your health care provider which countries are considered high risk.  Your parents were born in a high-risk country, and you have not been immunized against hepatitis B (hepatitis B vaccine).  You have HIV or AIDS.  You use needles to inject street drugs.  You live with someone who has hepatitis B.  You have had sex with someone who has hepatitis B.  You get hemodialysis treatment.  You take certain medicines for conditions, including cancer, organ transplantation, and autoimmune conditions. Hepatitis C  Blood testing is recommended for:  Everyone born from 18 through 1965.  Anyone with known risk factors for hepatitis C. Sexually transmitted infections (STIs)  You should be screened for sexually transmitted infections (STIs) including gonorrhea and chlamydia if:  You are sexually active and are younger than 39 years of age.  You are older than 39 years of age and your health care provider tells you  that you are at risk for this type of infection.  Your sexual activity has changed since you were last screened and you are at an increased risk for chlamydia or gonorrhea. Ask your health care provider if you are at risk.  If you do not have HIV, but are at risk, it may be recommended that you take a prescription medicine daily to prevent HIV infection. This is called pre-exposure prophylaxis (PrEP). You are considered at risk if:  You are sexually active and do  not regularly use condoms or know the HIV status of your partner(s).  You take drugs by injection.  You are sexually active with a partner who has HIV. Talk with your health care provider about whether you are at high risk of being infected with HIV. If you choose to begin PrEP, you should first be tested for HIV. You should then be tested every 3 months for as long as you are taking PrEP. Pregnancy  If you are premenopausal and you may become pregnant, ask your health care provider about preconception counseling.  If you may become pregnant, take 400 to 800 micrograms (mcg) of folic acid every day.  If you want to prevent pregnancy, talk to your health care provider about birth control (contraception). Osteoporosis and menopause  Osteoporosis is a disease in which the bones lose minerals and strength with aging. This can result in serious bone fractures. Your risk for osteoporosis can be identified using a bone density scan.  If you are 72 years of age or older, or if you are at risk for osteoporosis and fractures, ask your health care provider if you should be screened.  Ask your health care provider whether you should take a calcium or vitamin D supplement to lower your risk for osteoporosis.  Menopause may have certain physical symptoms and risks.  Hormone replacement therapy may reduce some of these symptoms and risks. Talk to your health care provider about whether hormone replacement therapy is right for you. Follow  these instructions at home:  Schedule regular health, dental, and eye exams.  Stay current with your immunizations.  Do not use any tobacco products including cigarettes, chewing tobacco, or electronic cigarettes.  If you are pregnant, do not drink alcohol.  If you are breastfeeding, limit how much and how often you drink alcohol.  Limit alcohol intake to no more than 1 drink per day for nonpregnant women. One drink equals 12 ounces of beer, 5 ounces of wine, or 1 ounces of hard liquor.  Do not use street drugs.  Do not share needles.  Ask your health care provider for help if you need support or information about quitting drugs.  Tell your health care provider if you often feel depressed.  Tell your health care provider if you have ever been abused or do not feel safe at home. This information is not intended to replace advice given to you by your health care provider. Make sure you discuss any questions you have with your health care provider. Document Released: 03/23/2011 Document Revised: 02/13/2016 Document Reviewed: 06/11/2015 Elsevier Interactive Patient Education  2017 [employer].

## 2016-12-16 NOTE — Progress Notes (Signed)
Pre visit review using our clinic review tool, if applicable. No additional management support is needed unless otherwise documented below in the visit note. 

## 2016-12-16 NOTE — Progress Notes (Signed)
Subjective:    Patient ID: Jamie Gordon, female    DOB: 23-Feb-1976, 41 y.o.   MRN: [id number]  CC: Jamie Gordon is a 41 y.o. female who presents today for physical exam.    HPI: Anxiety- starting with infertility treatment in 2014. No longer on prozac. No panic attacks. Sleeping well. No thoughts of hurting herself of anyone else.         Colorectal Cancer Screening: No family history.  Breast Cancer Screening: NO early family history Cervical Cancer Screening: due Bone Health screening/DEXA for 65+: No increased fracture risk. Defer screening at this time. Lung Cancer Screening: Doesn't have 30 year pack year history and age > 55 years.       Tetanus - UTD        HIV Screening- Candidate for ; declines.  Labs: Screening labs today. Exercise: Gets regular exercise, walks dog  Alcohol use: none Smoking/tobacco use: Nonsmoker.  Regular dental exams: UTD Wears seat belt: Yes. Skin: h/o basal cell; not following with derm at this time.   HISTORY:  Past Medical History:  Diagnosis Date  . Ectopic pregnancy    s/p left partial salpingectomy  . Endometriosis   . Female fertility problem   . Habitual abortion history, antepartum   . Miscarriage    x2 at 5 weeks    Past Surgical History:  Procedure Laterality Date  . ECTOPIC PREGNANCY SURGERY  02/2013   Dr. Greggory Keen  . WISDOM TOOTH EXTRACTION     Family History  Problem Relation Age of Onset  . Hyperlipidemia Mother   . Cancer Maternal Grandmother     ovarian  . Heart disease Maternal Grandmother   . Arthritis Father   . Hypertension Father   . Ovarian cancer Maternal Grandfather   . Diabetes Neg Hx   . Colon cancer Neg Hx   . Breast cancer Neg Hx       ALLERGIES: Latex  Current Outpatient Prescriptions on File Prior to Visit  Medication Sig Dispense Refill  . Prenatal MV-Min-Fe Fum-FA-DHA (PRENATAL 1 PO) Take by mouth.     No current facility-administered medications on file prior to visit.       Social History  Substance Use Topics  . Smoking status: Never Smoker  . Smokeless tobacco: Never Used  . Alcohol use No    Review of Systems  Constitutional: Negative for chills, fever and unexpected weight change.  HENT: Negative for congestion.   Respiratory: Negative for cough.   Cardiovascular: Negative for chest pain, palpitations and leg swelling.  Gastrointestinal: Negative for abdominal distention, abdominal pain, nausea and vomiting.  Genitourinary: Negative for dyspareunia.  Musculoskeletal: Negative for arthralgias and myalgias.  Skin: Negative for rash.  Neurological: Negative for headaches.  Hematological: Negative for adenopathy.  Psychiatric/Behavioral: Negative for confusion, sleep disturbance and suicidal ideas. The patient is nervous/anxious.       Objective:    BP 118/72   Pulse 91   Temp 98.3 F (36.8 C) (Oral)   Ht 5\' 4"  (1.626 m)   Wt 190 lb 3.2 oz (86.3 kg)   SpO2 98%   BMI 32.65 kg/m   BP Readings from Last 3 Encounters:  12/16/16 118/72  06/26/15 128/78  04/25/14 110/80   Wt Readings from Last 3 Encounters:  12/16/16 190 lb 3.2 oz (86.3 kg)  06/26/15 188 lb 14.4 oz (85.7 kg)  04/25/14 185 lb (83.9 kg)    Physical Exam  Constitutional: She appears well-developed and well-nourished.  Eyes:  Conjunctivae are normal.  Neck: No thyroid mass and no thyromegaly present.  Cardiovascular: Normal rate, regular rhythm, normal heart sounds and normal pulses.   Pulmonary/Chest: Effort normal and breath sounds normal. She has no wheezes. She has no rhonchi. She has no rales. Right breast exhibits no inverted nipple, no mass, no nipple discharge, no skin change and no tenderness. Left breast exhibits no inverted nipple, no mass, no nipple discharge, no skin change and no tenderness. Breasts are symmetrical.  No masses or asymmetry appreciated during CBE.  Genitourinary: Uterus is not enlarged, not fixed and not tender. Cervix exhibits no motion  tenderness, no discharge and no friability. Right adnexum displays no mass, no tenderness and no fullness. Left adnexum displays no mass, no tenderness and no fullness.  Genitourinary Comments: Pap performed. No CMT. Unable to appreciated ovaries.  Lymphadenopathy:       Head (right side): No submental, no submandibular, no tonsillar, no preauricular, no posterior auricular and no occipital adenopathy present.       Head (left side): No submental, no submandibular, no tonsillar, no preauricular, no posterior auricular and no occipital adenopathy present.       Right cervical: No superficial cervical, no deep cervical and no posterior cervical adenopathy present.      Left cervical: No superficial cervical, no deep cervical and no posterior cervical adenopathy present.    She has no axillary adenopathy.       Right axillary: No pectoral and no lateral adenopathy present.       Left axillary: No pectoral and no lateral adenopathy present. Neurological: She is alert.  Skin: Skin is warm and dry.  Psychiatric: She has a normal mood and affect. Her speech is normal and behavior is normal. Thought content normal.  Vitals reviewed.      Assessment & Plan:   Problem List Items Addressed This Visit      Other   Adjustment disorder with mixed anxiety and depressed mood    Start back on prozac. Will follow      Relevant Medications   FLUoxetine (PROZAC) 20 MG tablet   Routine general medical examination at a health care facility - Primary    Pap Smear done today. Clinical breast exam performed. Screening labs ordered today. Encouraged regular exercise. Referral to dermatology due to history.      Relevant Orders   CBC with Differential/Platelet   Comprehensive metabolic panel   Hemoglobin A1c   Lipid panel   TSH   VITAMIN D 25 Hydroxy (Vit-D Deficiency, Fractures)   Cytology - PAP   Ambulatory referral to Dermatology       I have discontinued Ms. Caton's nitrofurantoin  (macrocrystal-monohydrate) and phenazopyridine. I am also having her maintain her Prenatal MV-Min-Fe Fum-FA-DHA (PRENATAL 1 PO) and FLUoxetine.   Meds ordered this encounter  Medications  . FLUoxetine (PROZAC) 20 MG tablet    Sig: Take 1 tablet (20 mg total) by mouth daily.    Dispense:  90 tablet    Refill:  1    Order Specific Question:   Supervising Provider    Answer:   Sherlene ShamsULLO, TERESA L [[id number]]    Return precautions given.   Risks, benefits, and alternatives of the medications and treatment plan prescribed today were discussed, and patient expressed understanding.   Education regarding symptom management and diagnosis given to patient on AVS.   Continue to follow with Rennie PlowmanMargaret Arnett, FNP for routine health maintenance.   Rozanne M Kazmi and I agreed with plan.  Mable Paris, FNP

## 2016-12-16 NOTE — Assessment & Plan Note (Addendum)
Pap Smear done today. Clinical breast exam performed. Screening labs ordered today. Encouraged regular exercise. Referral to dermatology due to history.

## 2016-12-16 NOTE — Assessment & Plan Note (Signed)
Start back on prozac. Will follow

## 2016-12-18 LAB — CYTOLOGY - PAP
DIAGNOSIS: NEGATIVE
HPV: NOT DETECTED

## 2016-12-22 ENCOUNTER — Encounter: Payer: Self-pay | Admitting: Family

## 2017-01-15 ENCOUNTER — Encounter: Payer: Self-pay | Admitting: Family

## 2017-01-15 DIAGNOSIS — F4323 Adjustment disorder with mixed anxiety and depressed mood: Secondary | ICD-10-CM

## 2017-01-15 MED ORDER — FLUOXETINE HCL 20 MG PO TABS: 20.0000 mg | ORAL_TABLET | Freq: Every day | ORAL | 1 refills | Status: DC

## 2017-02-11 ENCOUNTER — Other Ambulatory Visit: Payer: Self-pay

## 2017-04-16 ENCOUNTER — Encounter: Payer: Self-pay | Admitting: Family

## 2017-04-16 DIAGNOSIS — F4323 Adjustment disorder with mixed anxiety and depressed mood: Secondary | ICD-10-CM

## 2017-04-16 MED ORDER — FLUOXETINE HCL 20 MG PO TABS: 20.0000 mg | ORAL_TABLET | Freq: Every day | ORAL | 1 refills | Status: DC

## 2017-04-26 ENCOUNTER — Encounter: Payer: Self-pay | Admitting: Family

## 2017-04-26 DIAGNOSIS — F4323 Adjustment disorder with mixed anxiety and depressed mood: Secondary | ICD-10-CM

## 2017-04-26 MED ORDER — FLUOXETINE HCL 20 MG PO TABS: 20.0000 mg | ORAL_TABLET | Freq: Every day | ORAL | 1 refills | Status: DC

## 2017-07-06 MED FILL — FLUoxetine HCL 20 MG CAPS: 20 | 90 days supply | Qty: 90 | Fill #0

## 2017-08-10 ENCOUNTER — Ambulatory Visit: Payer: Self-pay | Admitting: Skilled Nursing Facility1

## 2017-08-16 ENCOUNTER — Ambulatory Visit: Payer: Self-pay | Admitting: Registered"

## 2017-10-11 MED FILL — FLUoxetine HCL 20 MG CAPS: 20 | 90 days supply | Qty: 90 | Fill #1

## 2017-12-15 ENCOUNTER — Encounter: Payer: Self-pay | Admitting: Family

## 2017-12-15 DIAGNOSIS — F4323 Adjustment disorder with mixed anxiety and depressed mood: Secondary | ICD-10-CM

## 2017-12-15 MED ORDER — FLUOXETINE HCL 20 MG PO TABS: 20.0000 mg | ORAL_TABLET | Freq: Every day | ORAL | 0 refills | Status: DC

## 2018-04-18 ENCOUNTER — Telehealth: Payer: 59 | Admitting: Family

## 2018-04-18 DIAGNOSIS — J069 Acute upper respiratory infection, unspecified: Secondary | ICD-10-CM | POA: Diagnosis not present

## 2018-04-18 DIAGNOSIS — B9789 Other viral agents as the cause of diseases classified elsewhere: Secondary | ICD-10-CM

## 2018-04-18 MED ORDER — FLUTICASONE PROPIONATE 50 MCG/ACT NA SUSP: 2.0000 | Freq: Every day | NASAL | 6 refills | Status: DC

## 2018-04-18 MED ORDER — BENZONATATE 100 MG PO CAPS: 100.0000 mg | ORAL_CAPSULE | Freq: Three times a day (TID) | ORAL | 0 refills | Status: DC | PRN

## 2018-04-18 NOTE — Progress Notes (Signed)

## 2018-09-26 ENCOUNTER — Encounter: Payer: Self-pay | Admitting: Family

## 2018-09-30 ENCOUNTER — Encounter: Payer: Self-pay | Admitting: Family

## 2018-10-07 ENCOUNTER — Ambulatory Visit (INDEPENDENT_AMBULATORY_CARE_PROVIDER_SITE_OTHER): Payer: 59 | Admitting: Family

## 2018-10-07 ENCOUNTER — Encounter: Payer: Self-pay | Admitting: Family

## 2018-10-07 VITALS — BP 118/66 | HR 91 | Temp 98.5°F | Wt 207.4 lb

## 2018-10-07 DIAGNOSIS — F4323 Adjustment disorder with mixed anxiety and depressed mood: Secondary | ICD-10-CM | POA: Diagnosis not present

## 2018-10-07 DIAGNOSIS — Z0000 Encounter for general adult medical examination without abnormal findings: Secondary | ICD-10-CM

## 2018-10-07 MED ORDER — FLUOXETINE HCL 20 MG PO TABS: 20.0000 mg | ORAL_TABLET | Freq: Every day | ORAL | 3 refills | Status: DC

## 2018-10-07 NOTE — Patient Instructions (Addendum)
We placed a referral for mammogram this year. I asked that you call one the below locations and schedule this when it is convenient for you.   As discussed, I would like you to ask for 3D mammogram over the traditional 2D mammogram as new evidence suggest 3D is superior.   Please note that NOT all insurance companies cover 3D and you may have to pay a higher copay. You may call your insurance company to further clarify your benefits.   Options for Mammogram.    [facility]  [address]  [place], [place]  [phone]  * [facility]   [facility] [address] [place], [place] [phone] * Note if you ask for 3D mammogram at this location, you must request [place] location*   This is  Dr. Melina Schoolsullo's  example of a  "Low GI"  Diet:  It will allow you to lose 4 to 8  lbs  per month if you follow it carefully.  Your goal with exercise is a minimum of 30 minutes of aerobic exercise 5 days per week (Walking does not count once it becomes easy!)    All of the foods can be found at grocery stores and in bulk at [employer].  The Atkins protein bars and shakes are available in more varieties at Target, WalMart and Lowe's Foods.     7 AM Breakfast:  Choose from the following:  Low carbohydrate Protein  Shakes (I recommend the  Premier Protein chocolate shakes,  EAS AdvantEdge "Carb Control" shakes  Or the Atkins shakes all are under 3 net carbs)     a scrambled egg/bacon/cheese burrito made with Mission's "carb balance" whole wheat tortilla  (about 10 net carbs )  Medical laboratory scientific officerJimmy Deans sells microwaveable frittata (basically a quiche without the pastry crust) that is eaten cold and very convenient way to get your eggs.  8 carbs)  If you make your own protein shakes, avoid bananas and pineapple,  And use low carb greek yogurt or original /unsweetened almond or soy milk    Avoid cereal and bananas, oatmeal and cream of wheat and  grits. They are loaded with carbohydrates!   10 AM: high protein snack:  Protein bar by Atkins (the snack size, under 200 cal, usually < 6 net carbs).    A stick of cheese:  Around 1 carb,  100 cal     Dannon Light n Fit AustriaGreek Yogurt  (80 cal, 8 carbs)  Other so called "protein bars" and Greek yogurts tend to be loaded with carbohydrates.  Remember, in food advertising, the word "energy" is synonymous for " carbohydrate."  Lunch:   A Sandwich using the bread choices listed, Can use any  Eggs,  lunchmeat, grilled meat or canned tuna), avocado, regular mayo/mustard  and cheese.  A Salad using blue cheese, ranch,  Goddess or vinagrette,  Avoid taco shells, croutons or "confetti" and no "candied nuts" but regular nuts OK.   No pretzels, nabs  or chips.  Pickles and miniature sweet peppers are a good low carb alternative that provide a "crunch"  The bread is the only source of carbohydrate in a sandwich and  can be decreased by trying some of the attached alternatives to traditional loaf bread   Avoid "Low fat dressings, as well as Reyne DumasCatalina and [place] Food[place] dressings They are loaded with sugar!   3 PM/ Mid day  Snack:  Consider  1 ounce of  almonds, walnuts, pistachios, pecans, peanuts,  Macadamia nuts or a nut medley.  Avoid "granola and granola bars "  Mixed nuts are ok in moderation as long as there are no raisins,  cranberries or dried fruit.   KIND bars are OK if you get the low glycemic index variety   Try the prosciutto/mozzarella cheese sticks by Fiorruci  In deli /backery section   High protein      6 PM  Dinner:     Meat/fowl/fish with a green salad, and either broccoli, cauliflower, green beans, spinach, brussel sprouts or  Lima beans. DO NOT BREAD THE PROTEIN!!      There is a low carb pasta by Dreamfield's that is acceptable and tastes great: only 5 digestible carbs/serving.( All grocery stores but BJs carry it ) Several ready made meals are available low carb:   Try Michel  Angelo's chicken piccata or chicken or eggplant parm over low carb pasta.(Lowes and BJs)   Clifton CustardAaron Sanchez's "Carnitas" (pulled pork, no sauce,  0 carbs) or his beef pot roast to make a dinner burrito (at [employer]J's)  Pesto over low carb pasta (bj's sells a good quality pesto in the center refrigerated section of the deli   Try satueeing  Roosvelt HarpsBok Choy with mushroooms as a good side   Green Giant makes a mashed cauliflower that tastes like mashed potatoes  Whole wheat pasta is still full of digestible carbs and  Not as low in glycemic index as Dreamfield's.   Brown rice is still rice,  So skip the rice and noodles if you eat Congohinese or New Zealandhai (or at least limit to 1/2 cup)  9 PM snack :   Breyer's "low carb" fudgsicle or  ice cream bar (Carb Smart line), or  Weight Watcher's ice cream bar , or another "no sugar added" ice cream;  a serving of fresh berries/cherries with whipped cream   Cheese or DANNON'S LlGHT N FIT GREEK YOGURT  8 ounces of [place] unsweetened almond/cococunut milk    Treat yourself to a parfait made with whipped cream blueberiies, walnuts and vanilla greek yogurt  Avoid bananas, pineapple, grapes  and watermelon on a regular basis because they are high in sugar.  THINK OF THEM AS DESSERT  Remember that snack Substitutions should be less than 10 NET carbs per serving and meals < 20 carbs. Remember to subtract fiber grams to get the "net carbs."  @TULLOBREADPACKAGE @

## 2018-10-07 NOTE — Assessment & Plan Note (Signed)
Resume Prozac 

## 2018-10-07 NOTE — Progress Notes (Signed)
Subjective:    Patient ID: Jamie Gordon, female    DOB: 10-07-1975, 43 y.o.   MRN: [id number]  CC: Jamie Gordon is a 43 y.o. female who presents today for physical exam.    HPI: Overall feels well today.    Depression- Out of prozac, would like restart.  Feels well on medication and dose.  No thoughts of hurting herself or anyone else.   Colorectal Cancer Screening: No early family history Breast Cancer Screening: No early family history. Cervical Cancer Screening: UTD, 2018        Tetanus - utd         Labs: Screening labs today. Exercise: Gets regular exercise.  Alcohol use: none Smoking/tobacco use: Nonsmoker.  Regular dental exams: UTD Wears seat belt: Yes. Skin: History of basal cell carcinoma; no new lesions.  Declines dermatology consult at this time. HISTORY:  Past Medical History:  Diagnosis Date  . Ectopic pregnancy    s/p left partial salpingectomy  . Endometriosis   . Female fertility problem   . Habitual abortion history, antepartum   . Miscarriage    x2 at 5 weeks    Past Surgical History:  Procedure Laterality Date  . ECTOPIC PREGNANCY SURGERY  02/2013   Dr. Greggory Keen  . WISDOM TOOTH EXTRACTION     Family History  Problem Relation Age of Onset  . Hyperlipidemia Mother   . Cancer Maternal Grandmother        ovarian  . Heart disease Maternal Grandmother   . Arthritis Father   . Hypertension Father   . Ovarian cancer Maternal Grandfather   . Diabetes Neg Hx   . Colon cancer Neg Hx   . Breast cancer Neg Hx       ALLERGIES: Latex  Current Outpatient Medications on File Prior to Visit  Medication Sig Dispense Refill  . fluticasone (FLONASE) 50 MCG/ACT nasal spray Place 2 sprays into both nostrils daily. 16 g 6  . Prenatal MV-Min-Fe Fum-FA-DHA (PRENATAL 1 PO) Take by mouth.     No current facility-administered medications on file prior to visit.     Social History   Tobacco Use  . Smoking status: Never Smoker  . Smokeless  tobacco: Never Used  Substance Use Topics  . Alcohol use: No  . Drug use: No    Review of Systems  Constitutional: Negative for chills, fever and unexpected weight change.  HENT: Negative for congestion.   Respiratory: Negative for cough.   Cardiovascular: Negative for chest pain, palpitations and leg swelling.  Gastrointestinal: Negative for nausea and vomiting.  Genitourinary: Negative for vaginal discharge and vaginal pain.  Musculoskeletal: Negative for arthralgias and myalgias.  Skin: Negative for rash.  Neurological: Negative for headaches.  Hematological: Negative for adenopathy.  Psychiatric/Behavioral: Negative for confusion.      Objective:    BP 118/66 (BP Location: Left Arm, Patient Position: Standing, Cuff Size: Large)   Pulse 91   Temp 98.5 F (36.9 C)   Wt 207 lb 6.4 oz (94.1 kg)   SpO2 95%   BMI 35.60 kg/m   BP Readings from Last 3 Encounters:  10/07/18 118/66  12/16/16 118/72  06/26/15 128/78   Wt Readings from Last 3 Encounters:  10/07/18 207 lb 6.4 oz (94.1 kg)  12/16/16 190 lb 3.2 oz (86.3 kg)  06/26/15 188 lb 14.4 oz (85.7 kg)    Physical Exam Vitals signs reviewed.  Constitutional:      Appearance: She is well-developed.  Eyes:  Conjunctiva/sclera: Conjunctivae normal.  Neck:     Thyroid: No thyroid mass or thyromegaly.  Cardiovascular:     Rate and Rhythm: Normal rate and regular rhythm.     Pulses: Normal pulses.     Heart sounds: Normal heart sounds.  Pulmonary:     Effort: Pulmonary effort is normal.     Breath sounds: Normal breath sounds. No wheezing, rhonchi or rales.  Chest:     Breasts: Breasts are symmetrical.        Right: No inverted nipple, mass, nipple discharge, skin change or tenderness.        Left: No inverted nipple, mass, nipple discharge, skin change or tenderness.  Lymphadenopathy:     Head:     Right side of head: No submental, submandibular, tonsillar, preauricular, posterior auricular or occipital  adenopathy.     Left side of head: No submental, submandibular, tonsillar, preauricular, posterior auricular or occipital adenopathy.     Cervical: No cervical adenopathy.     Right cervical: No superficial, deep or posterior cervical adenopathy.    Left cervical: No superficial, deep or posterior cervical adenopathy.  Skin:    General: Skin is warm and dry.  Neurological:     Mental Status: She is alert.  Psychiatric:        Speech: Speech normal.        Behavior: Behavior normal.        Thought Content: Thought content normal.        Assessment & Plan:   Problem List Items Addressed This Visit      Other   Adjustment disorder with mixed anxiety and depressed mood    Resume Prozac.      Relevant Medications   FLUoxetine (PROZAC) 20 MG tablet   Routine general medical examination at a health care facility - Primary    Clinical breast exam performed.  Politely declines dermatology consult.  Pelvic exam deferred in the absence of complaints, Pap smear is up-to-date.  Mammogram ordered, patient understands to schedule      Relevant Orders   TSH   CBC with Differential/Platelet   Comprehensive metabolic panel   Hemoglobin A1c   VITAMIN D 25 Hydroxy (Vit-D Deficiency, Fractures)   Lipid panel   MM 3D SCREEN BREAST BILATERAL       I have discontinued Keiosha M. Lapaglia's benzonatate. I am also having her maintain her Prenatal MV-Min-Fe Fum-FA-DHA (PRENATAL 1 PO), fluticasone, and FLUoxetine.   Meds ordered this encounter  Medications  . FLUoxetine (PROZAC) 20 MG tablet    Sig: Take 1 tablet (20 mg total) by mouth daily.    Dispense:  90 tablet    Refill:  3    Order Specific Question:   Supervising Provider    Answer:   Sherlene ShamsULLO, TERESA L [[id number]]    Return precautions given.   Risks, benefits, and alternatives of the medications and treatment plan prescribed today were discussed, and patient expressed understanding.   Education regarding symptom management and  diagnosis given to patient on AVS.   Continue to follow with Allegra GranaArnett, Margaret G, FNP for routine health maintenance.   Elleanor M Fujimoto and I agreed with plan.   Rennie PlowmanMargaret Arnett, FNP

## 2018-10-07 NOTE — Assessment & Plan Note (Signed)
Clinical breast exam performed.  Politely declines dermatology consult.  Pelvic exam deferred in the absence of complaints, Pap smear is up-to-date.  Mammogram ordered, patient understands to schedule

## 2018-10-08 LAB — CBC WITH DIFFERENTIAL/PLATELET
Absolute Monocytes: 553 cells/uL (ref 200–950)
Basophils Absolute: 51 cells/uL (ref 0–200)
Basophils Relative: 0.6 %
Eosinophils Absolute: 94 cells/uL (ref 15–500)
Eosinophils Relative: 1.1 %
HCT: 35.3 % (ref 35.0–45.0)
HEMOGLOBIN: 11.5 g/dL — AB (ref 11.7–15.5)
Lymphs Abs: 2134 cells/uL (ref 850–3900)
MCH: 23.3 pg — ABNORMAL LOW (ref 27.0–33.0)
MCHC: 32.6 g/dL (ref 32.0–36.0)
MCV: 71.5 fL — ABNORMAL LOW (ref 80.0–100.0)
MPV: 11 fL (ref 7.5–12.5)
Monocytes Relative: 6.5 %
NEUTROS ABS: 5670 {cells}/uL (ref 1500–7800)
Neutrophils Relative %: 66.7 %
Platelets: 349 10*3/uL (ref 140–400)
RBC: 4.94 10*6/uL (ref 3.80–5.10)
RDW: 15.2 % — ABNORMAL HIGH (ref 11.0–15.0)
Total Lymphocyte: 25.1 %
WBC: 8.5 10*3/uL (ref 3.8–10.8)

## 2018-10-08 LAB — COMPREHENSIVE METABOLIC PANEL
AG Ratio: 1.5 (calc) (ref 1.0–2.5)
ALT: 10 U/L (ref 6–29)
AST: 13 U/L (ref 10–30)
Albumin: 4.1 g/dL (ref 3.6–5.1)
Alkaline phosphatase (APISO): 98 U/L (ref 33–115)
BUN: 9 mg/dL (ref 7–25)
CO2: 23 mmol/L (ref 20–32)
Calcium: 9 mg/dL (ref 8.6–10.2)
Chloride: 105 mmol/L (ref 98–110)
Creat: 0.81 mg/dL (ref 0.50–1.10)
Globulin: 2.7 g/dL (calc) (ref 1.9–3.7)
Glucose, Bld: 85 mg/dL (ref 65–99)
Potassium: 3.7 mmol/L (ref 3.5–5.3)
Sodium: 142 mmol/L (ref 135–146)
Total Bilirubin: 0.4 mg/dL (ref 0.2–1.2)
Total Protein: 6.8 g/dL (ref 6.1–8.1)

## 2018-10-08 LAB — LIPID PANEL
Cholesterol: 192 mg/dL (ref <200)
HDL: 42 mg/dL — ABNORMAL LOW (ref >50)
LDL Cholesterol (Calc): 127 mg/dL (calc) — ABNORMAL HIGH
Non-HDL Cholesterol (Calc): 150 mg/dL (calc) — ABNORMAL HIGH (ref <130)
Total CHOL/HDL Ratio: 4.6 (calc) (ref <5.0)
Triglycerides: 118 mg/dL (ref <150)

## 2018-10-08 LAB — HEMOGLOBIN A1C
Hgb A1c MFr Bld: 5.6 % of total Hgb (ref <5.7)
Mean Plasma Glucose: 114 (calc)
eAG (mmol/L): 6.3 (calc)

## 2018-10-08 LAB — VITAMIN D 25 HYDROXY (VIT D DEFICIENCY, FRACTURES): Vit D, 25-Hydroxy: 27 ng/mL — ABNORMAL LOW (ref 30–100)

## 2018-10-08 LAB — TSH: TSH: 1.88 mIU/L

## 2018-10-10 ENCOUNTER — Other Ambulatory Visit: Payer: Self-pay | Admitting: Family

## 2018-10-10 DIAGNOSIS — D649 Anemia, unspecified: Secondary | ICD-10-CM

## 2018-10-20 ENCOUNTER — Encounter: Payer: Self-pay | Admitting: Family

## 2018-10-21 ENCOUNTER — Other Ambulatory Visit: Payer: Self-pay | Admitting: Radiology

## 2018-10-21 ENCOUNTER — Other Ambulatory Visit (INDEPENDENT_AMBULATORY_CARE_PROVIDER_SITE_OTHER): Payer: 59

## 2018-10-21 DIAGNOSIS — D649 Anemia, unspecified: Secondary | ICD-10-CM

## 2018-10-22 LAB — CBC WITH DIFFERENTIAL/PLATELET
Absolute Monocytes: 622 cells/uL (ref 200–950)
BASOS PCT: 1.1 %
Basophils Absolute: 81 cells/uL (ref 0–200)
Eosinophils Absolute: 52 cells/uL (ref 15–500)
Eosinophils Relative: 0.7 %
HCT: 35.3 % (ref 35.0–45.0)
Hemoglobin: 11.5 g/dL — ABNORMAL LOW (ref 11.7–15.5)
Lymphs Abs: 1983 cells/uL (ref 850–3900)
MCH: 23.2 pg — ABNORMAL LOW (ref 27.0–33.0)
MCHC: 32.6 g/dL (ref 32.0–36.0)
MCV: 71.3 fL — ABNORMAL LOW (ref 80.0–100.0)
MPV: 11.3 fL (ref 7.5–12.5)
Monocytes Relative: 8.4 %
Neutro Abs: 4662 cells/uL (ref 1500–7800)
Neutrophils Relative %: 63 %
PLATELETS: 340 10*3/uL (ref 140–400)
RBC: 4.95 10*6/uL (ref 3.80–5.10)
RDW: 16.1 % — ABNORMAL HIGH (ref 11.0–15.0)
Total Lymphocyte: 26.8 %
WBC: 7.4 10*3/uL (ref 3.8–10.8)

## 2018-10-22 LAB — IRON,TIBC AND FERRITIN PANEL
%SAT: 5 % — AB (ref 16–45)
Ferritin: 13 ng/mL — ABNORMAL LOW (ref 16–154)
Iron: 20 ug/dL — ABNORMAL LOW (ref 40–190)
TIBC: 397 mcg/dL (calc) (ref 250–450)

## 2018-10-24 ENCOUNTER — Encounter: Payer: Self-pay | Admitting: Family

## 2018-10-26 ENCOUNTER — Other Ambulatory Visit: Payer: Self-pay | Admitting: Family

## 2018-10-26 DIAGNOSIS — Z1231 Encounter for screening mammogram for malignant neoplasm of breast: Secondary | ICD-10-CM

## 2018-11-02 ENCOUNTER — Ambulatory Visit
Admission: RE | Admit: 2018-11-02 | Discharge: 2018-11-02 | Disposition: A | Discharge status: Home / Self Care | Payer: 59 | Source: Ambulatory Visit | Attending: Family | Admitting: Family

## 2018-11-02 DIAGNOSIS — Z1231 Encounter for screening mammogram for malignant neoplasm of breast: Secondary | ICD-10-CM | POA: Insufficient documentation

## 2019-09-19 ENCOUNTER — Other Ambulatory Visit: Payer: Self-pay

## 2019-09-19 ENCOUNTER — Telehealth: Payer: Self-pay | Admitting: Family

## 2019-09-19 DIAGNOSIS — F4323 Adjustment disorder with mixed anxiety and depressed mood: Secondary | ICD-10-CM

## 2019-09-19 DIAGNOSIS — Z1239 Encounter for other screening for malignant neoplasm of breast: Secondary | ICD-10-CM

## 2019-09-19 MED ORDER — FLUOXETINE HCL 20 MG PO TABS: 20.0000 mg | ORAL_TABLET | Freq: Every day | ORAL | 3 refills | Status: DC

## 2019-09-19 NOTE — Telephone Encounter (Signed)
Pt needs a refill on FLUoxetine (PROZAC) 20 MG tablet. Pt has an appt on 10/26/2018 but rx will run out before then. Pt also has a question about mammogram. Please advise on mobile number.

## 2019-09-19 NOTE — Telephone Encounter (Signed)
Okay to order for patient? She does have f/u with you 2/5. She was wanting to go ahead & have done with [facility] due to them sending her a reminder letter.

## 2019-09-20 NOTE — Telephone Encounter (Signed)
Pt notified that she could schedule & prozac refilled.

## 2019-09-20 NOTE — Telephone Encounter (Signed)
Call pt I ordered mammogram; she  May schedule  You may refill prozac

## 2019-10-10 ENCOUNTER — Encounter: Payer: Self-pay | Admitting: Family

## 2019-10-18 ENCOUNTER — Encounter: Payer: 59 | Admitting: Family

## 2019-10-27 ENCOUNTER — Encounter: Payer: 59 | Admitting: Family

## 2020-09-10 ENCOUNTER — Encounter: Payer: Self-pay | Admitting: Family

## 2020-09-10 ENCOUNTER — Other Ambulatory Visit: Payer: Self-pay | Admitting: Family

## 2020-09-10 ENCOUNTER — Other Ambulatory Visit: Payer: Self-pay

## 2020-09-10 DIAGNOSIS — F4323 Adjustment disorder with mixed anxiety and depressed mood: Secondary | ICD-10-CM

## 2020-09-10 MED ORDER — FLUOXETINE HCL 20 MG PO TABS: 20.0000 mg | ORAL_TABLET | Freq: Every day | ORAL | 3 refills | Status: DC

## 2020-11-08 ENCOUNTER — Other Ambulatory Visit (HOSPITAL_COMMUNITY)
Admission: RE | Admit: 2020-11-08 | Discharge: 2020-11-08 | Disposition: A | Discharge status: Home / Self Care | Payer: 59 | Source: Ambulatory Visit | Attending: Family | Admitting: Family

## 2020-11-08 ENCOUNTER — Other Ambulatory Visit: Payer: Self-pay

## 2020-11-08 ENCOUNTER — Encounter: Payer: Self-pay | Admitting: Family

## 2020-11-08 ENCOUNTER — Other Ambulatory Visit: Payer: Self-pay | Admitting: Family

## 2020-11-08 ENCOUNTER — Ambulatory Visit (INDEPENDENT_AMBULATORY_CARE_PROVIDER_SITE_OTHER): Payer: 59 | Admitting: Family

## 2020-11-08 VITALS — BP 120/78 | HR 84 | Temp 98.9°F | Ht 64.02 in | Wt 210.2 lb

## 2020-11-08 DIAGNOSIS — F4323 Adjustment disorder with mixed anxiety and depressed mood: Secondary | ICD-10-CM

## 2020-11-08 DIAGNOSIS — Z0000 Encounter for general adult medical examination without abnormal findings: Secondary | ICD-10-CM | POA: Diagnosis not present

## 2020-11-08 MED ORDER — FLUOXETINE HCL 40 MG PO CAPS: 40.0000 mg | ORAL_CAPSULE | Freq: Every day | ORAL | 3 refills | Status: DC

## 2020-11-08 NOTE — Assessment & Plan Note (Addendum)
CBE and pap smear performed. Encouraged to continue exercise. Mammogram ordered and patient will call to schedule.

## 2020-11-08 NOTE — Progress Notes (Signed)
Subjective:    Patient ID: Jamie Gordon, female    DOB: 10/06/1975, 45 y.o.   MRN: [id number]  CC: Jamie Gordon is a 45 y.o. female who presents today for physical exam.    HPI: Feels well today No new complaints  Anxiety- feels well on prozac 20mg  and finds medication to be helpful. She has occasional breakthrough anxiety and interested in increasing medication. No depression.  Sleeping well.  No si/hi.    Colorectal Cancer Screening: no early family history Breast Cancer Screening: Mammogram due Cervical Cancer Screening: due         Tetanus - UTD        Hepatitis C screening - Candidate for, declines.   Labs: Declines creening labs today. Exercise: Gets regular exercise walking daily for 30 minutes.  Alcohol use:  none Smoking/tobacco use: Nonsmoker.     HISTORY:  Past Medical History:  Diagnosis Date  . Ectopic pregnancy    s/p left partial salpingectomy  . Endometriosis   . Female fertility problem   . Habitual abortion history, antepartum   . Miscarriage    x2 at 5 weeks    Past Surgical History:  Procedure Laterality Date  . ECTOPIC PREGNANCY SURGERY  02/2013   Dr. 03/2013  . WISDOM TOOTH EXTRACTION     Family History  Problem Relation Age of Onset  . Hyperlipidemia Mother   . Cancer Maternal Grandmother        ovarian  . Heart disease Maternal Grandmother   . Arthritis Father   . Hypertension Father   . Ovarian cancer Maternal Grandfather   . Diabetes Neg Hx   . Colon cancer Neg Hx   . Breast cancer Neg Hx       ALLERGIES: Latex  Current Outpatient Medications on File Prior to Visit  Medication Sig Dispense Refill  . Prenatal MV-Min-Fe Fum-FA-DHA (PRENATAL 1 PO) Take by mouth.     No current facility-administered medications on file prior to visit.    Social History   Tobacco Use  . Smoking status: Never Smoker  . Smokeless tobacco: Never Used  Substance Use Topics  . Alcohol use: No  . Drug use: No    Review of  Systems  Constitutional: Negative for chills, fever and unexpected weight change.  HENT: Negative for congestion.   Respiratory: Negative for cough.   Cardiovascular: Negative for chest pain, palpitations and leg swelling.  Gastrointestinal: Negative for nausea and vomiting.  Musculoskeletal: Negative for arthralgias and myalgias.  Skin: Negative for rash.  Neurological: Negative for headaches.  Hematological: Negative for adenopathy.  Psychiatric/Behavioral: Negative for confusion, sleep disturbance and suicidal ideas. The patient is nervous/anxious.       Objective:    BP 120/78   Pulse 84   Temp 98.9 F (37.2 C)   Ht 5' 4.02" (1.626 m)   Wt 210 lb 3.2 oz (95.3 kg)   SpO2 97%   BMI 36.06 kg/m   BP Readings from Last 3 Encounters:  11/08/20 120/78  10/07/18 118/66  12/16/16 118/72   Wt Readings from Last 3 Encounters:  11/08/20 210 lb 3.2 oz (95.3 kg)  10/07/18 207 lb 6.4 oz (94.1 kg)  12/16/16 190 lb 3.2 oz (86.3 kg)    Physical Exam Vitals reviewed.  Constitutional:      Appearance: She is well-developed and well-nourished.  Eyes:     Conjunctiva/sclera: Conjunctivae normal.  Neck:     Thyroid: No thyroid mass or thyromegaly.  Cardiovascular:  Rate and Rhythm: Normal rate and regular rhythm.     Pulses: Normal pulses.     Heart sounds: Normal heart sounds.  Pulmonary:     Effort: Pulmonary effort is normal.     Breath sounds: Normal breath sounds. No wheezing, rhonchi or rales.     Comments: No masses or asymmetry appreciated during CBE. Chest:  Breasts: Breasts are symmetrical.     Right: No inverted nipple, mass, nipple discharge, skin change or tenderness.     Left: No inverted nipple, mass, nipple discharge, skin change or tenderness.    Genitourinary:    Cervix: No cervical motion tenderness, discharge or friability.     Uterus: Not enlarged, not fixed and not tender.      Adnexa:        Right: No mass, tenderness or fullness.         Left: No  mass, tenderness or fullness.       Comments: Pap performed. No CMT. Unable to appreciated ovaries. Lymphadenopathy:     Head:     Right side of head: No submental, submandibular, tonsillar, preauricular, posterior auricular or occipital adenopathy.     Left side of head: No submental, submandibular, tonsillar, preauricular, posterior auricular or occipital adenopathy.     Cervical:     Right cervical: No superficial, deep or posterior cervical adenopathy.    Left cervical: No superficial, deep or posterior cervical adenopathy.     Upper Body:  No axillary adenopathy present.    Right upper body: No pectoral or lateral adenopathy.     Left upper body: No pectoral or lateral adenopathy.  Skin:    General: Skin is warm and dry.  Neurological:     Mental Status: She is alert.  Psychiatric:        Mood and Affect: Mood and affect normal.        Speech: Speech normal.        Behavior: Behavior normal.        Thought Content: Thought content normal.        Assessment & Plan:   Problem List Items Addressed This Visit      Other   Adjustment disorder with mixed anxiety and depressed mood    Breakthrough anxiety. Increase prozac to 40mg .       Relevant Medications   FLUoxetine (PROZAC) 40 MG capsule   Routine general medical examination at a health care facility - Primary    CBE and pap smear performed. Encouraged to continue exercise. Mammogram ordered and patient will call to schedule.      Relevant Orders   Cytology - PAP   MM 3D SCREEN BREAST BILATERAL       I have discontinued Oumou M. Fontan's fluticasone and FLUoxetine. I am also having her start on FLUoxetine. Additionally, I am having her maintain her Prenatal MV-Min-Fe Fum-FA-DHA (PRENATAL 1 PO).   Meds ordered this encounter  Medications  . FLUoxetine (PROZAC) 40 MG capsule    Sig: Take 1 capsule (40 mg total) by mouth daily.    Dispense:  90 capsule    Refill:  3    Order Specific Question:   Supervising  Provider    Answer:   M [[id number]]    Return precautions given.   Risks, benefits, and alternatives of the medications and treatment plan prescribed today were discussed, and patient expressed understanding.   Education regarding symptom management and diagnosis given to patient on AVS.   Continue to  follow with Allegra Grana, FNP for routine health maintenance.   Smera M Kehres and I agreed with plan.   Rennie Plowman, FNP

## 2020-11-08 NOTE — Patient Instructions (Signed)
Increase prozac to 40mg  daily  Nice to see you!   Health Maintenance, Female Adopting a healthy lifestyle and getting preventive care are important in promoting health and wellness. Ask your health care provider about:  The right schedule for you to have regular tests and exams.  Things you can do on your own to prevent diseases and keep yourself healthy. What should I know about diet, weight, and exercise? Eat a healthy diet  Eat a diet that includes plenty of vegetables, fruits, low-fat dairy products, and lean protein.  Do not eat a lot of foods that are high in solid fats, added sugars, or sodium.   Maintain a healthy weight Body mass index (BMI) is used to identify weight problems. It estimates body fat based on height and weight. Your health care provider can help determine your BMI and help you achieve or maintain a healthy weight. Get regular exercise Get regular exercise. This is one of the most important things you can do for your health. Most adults should:  Exercise for at least 150 minutes each week. The exercise should increase your heart rate and make you sweat (moderate-intensity exercise).  Do strengthening exercises at least twice a week. This is in addition to the moderate-intensity exercise.  Spend less time sitting. Even light physical activity can be beneficial. Watch cholesterol and blood lipids Have your blood tested for lipids and cholesterol at 43 years of age, then have this test every 5 years. Have your cholesterol levels checked more often if:  Your lipid or cholesterol levels are high.  You are older than 43 years of age.  You are at high risk for heart disease. What should I know about cancer screening? Depending on your health history and family history, you may need to have cancer screening at various ages. This may include screening for:  Breast cancer.  Cervical cancer.  Colorectal cancer.  Skin cancer.  Lung cancer. What should I  know about heart disease, diabetes, and high blood pressure? Blood pressure and heart disease  High blood pressure causes heart disease and increases the risk of stroke. This is more likely to develop in people who have high blood pressure readings, are of African descent, or are overweight.  Have your blood pressure checked: ? Every 3-5 years if you are 69-13 years of age. ? Every year if you are 45 years old or older. Diabetes Have regular diabetes screenings. This checks your fasting blood sugar level. Have the screening done:  Once every three years after age 35 if you are at a normal weight and have a low risk for diabetes.  More often and at a younger age if you are overweight or have a high risk for diabetes. What should I know about preventing infection? Hepatitis B If you have a higher risk for hepatitis B, you should be screened for this virus. Talk with your health care provider to find out if you are at risk for hepatitis B infection. Hepatitis C Testing is recommended for:  Everyone born from [birth year] through 1963.  Anyone with known risk factors for hepatitis C. Sexually transmitted infections (STIs)  Get screened for STIs, including gonorrhea and chlamydia, if: ? You are sexually active and are younger than 43 years of age. ? You are older than 43 years of age and your health care provider tells you that you are at risk for this type of infection. ? Your sexual activity has changed since you were last screened, and you are  at increased risk for chlamydia or gonorrhea. Ask your health care provider if you are at risk.  Ask your health care provider about whether you are at high risk for HIV. Your health care provider may recommend a prescription medicine to help prevent HIV infection. If you choose to take medicine to prevent HIV, you should first get tested for HIV. You should then be tested every 3 months for as long as you are taking the medicine. Pregnancy  If you are  about to stop having your period (premenopausal) and you may become pregnant, seek counseling before you get pregnant.  Take 400 to 800 micrograms (mcg) of folic acid every day if you become pregnant.  Ask for birth control (contraception) if you want to prevent pregnancy. Osteoporosis and menopause Osteoporosis is a disease in which the bones lose minerals and strength with aging. This can result in bone fractures. If you are 9 years old or older, or if you are at risk for osteoporosis and fractures, ask your health care provider if you should:  Be screened for bone loss.  Take a calcium or vitamin D supplement to lower your risk of fractures.  Be given hormone replacement therapy (HRT) to treat symptoms of menopause. Follow these instructions at home: Lifestyle  Do not use any products that contain nicotine or tobacco, such as cigarettes, e-cigarettes, and chewing tobacco. If you need help quitting, ask your health care provider.  Do not use street drugs.  Do not share needles.  Ask your health care provider for help if you need support or information about quitting drugs. Alcohol use  Do not drink alcohol if: ? Your health care provider tells you not to drink. ? You are pregnant, may be pregnant, or are planning to become pregnant.  If you drink alcohol: ? Limit how much you use to 0-1 drink a day. ? Limit intake if you are breastfeeding.  Be aware of how much alcohol is in your drink. In the [place] one drink equals one 12 oz bottle of beer (355 mL), one 5 oz glass of wine (148 mL), or one 1 oz glass of hard liquor (44 mL). General instructions  Schedule regular health, dental, and eye exams.  Stay current with your vaccines.  Tell your health care provider if: ? You often feel depressed. ? You have ever been abused or do not feel safe at home. Summary  Adopting a healthy lifestyle and getting preventive care are important in promoting health and wellness.  Follow  your health care provider's instructions about healthy diet, exercising, and getting tested or screened for diseases.  Follow your health care provider's instructions on monitoring your cholesterol and blood pressure. This information is not intended to replace advice given to you by your health care provider. Make sure you discuss any questions you have with your health care provider. Document Revised: 08/31/2018 Document Reviewed: 08/31/2018 Elsevier Patient Education  2021 [employer].

## 2020-11-08 NOTE — Assessment & Plan Note (Signed)
Breakthrough anxiety. Increase prozac to 40mg .

## 2020-11-11 ENCOUNTER — Encounter: Payer: Self-pay | Admitting: Family

## 2020-11-11 LAB — CYTOLOGY - PAP
Comment: NEGATIVE
Diagnosis: NEGATIVE
High risk HPV: NEGATIVE

## 2021-02-23 MED FILL — Fluoxetine HCl Cap 40 MG: ORAL | 90 days supply | Qty: 90 | Fill #0 | Status: CN

## 2021-02-24 ENCOUNTER — Other Ambulatory Visit: Payer: Self-pay

## 2021-02-24 ENCOUNTER — Telehealth: Payer: Self-pay

## 2021-02-24 DIAGNOSIS — K828 Other specified diseases of gallbladder: Secondary | ICD-10-CM | POA: Diagnosis not present

## 2021-02-24 DIAGNOSIS — K82A1 Gangrene of gallbladder in cholecystitis: Secondary | ICD-10-CM | POA: Diagnosis not present

## 2021-02-24 DIAGNOSIS — R1011 Right upper quadrant pain: Secondary | ICD-10-CM | POA: Diagnosis not present

## 2021-02-24 DIAGNOSIS — K8042 Calculus of bile duct with acute cholecystitis without obstruction: Secondary | ICD-10-CM | POA: Diagnosis not present

## 2021-02-24 DIAGNOSIS — R7989 Other specified abnormal findings of blood chemistry: Secondary | ICD-10-CM | POA: Diagnosis not present

## 2021-02-24 DIAGNOSIS — E669 Obesity, unspecified: Secondary | ICD-10-CM | POA: Diagnosis not present

## 2021-02-24 DIAGNOSIS — K219 Gastro-esophageal reflux disease without esophagitis: Secondary | ICD-10-CM | POA: Diagnosis present

## 2021-02-24 DIAGNOSIS — F4323 Adjustment disorder with mixed anxiety and depressed mood: Secondary | ICD-10-CM | POA: Diagnosis present

## 2021-02-24 DIAGNOSIS — K810 Acute cholecystitis: Secondary | ICD-10-CM | POA: Diagnosis not present

## 2021-02-24 DIAGNOSIS — K839 Disease of biliary tract, unspecified: Secondary | ICD-10-CM | POA: Diagnosis not present

## 2021-02-24 DIAGNOSIS — R7401 Elevation of levels of liver transaminase levels: Secondary | ICD-10-CM | POA: Diagnosis not present

## 2021-02-24 DIAGNOSIS — Z79899 Other long term (current) drug therapy: Secondary | ICD-10-CM | POA: Diagnosis not present

## 2021-02-24 DIAGNOSIS — Z8249 Family history of ischemic heart disease and other diseases of the circulatory system: Secondary | ICD-10-CM

## 2021-02-24 DIAGNOSIS — R748 Abnormal levels of other serum enzymes: Secondary | ICD-10-CM | POA: Diagnosis not present

## 2021-02-24 DIAGNOSIS — E876 Hypokalemia: Secondary | ICD-10-CM | POA: Diagnosis present

## 2021-02-24 DIAGNOSIS — R109 Unspecified abdominal pain: Secondary | ICD-10-CM | POA: Diagnosis not present

## 2021-02-24 DIAGNOSIS — Z20822 Contact with and (suspected) exposure to covid-19: Secondary | ICD-10-CM | POA: Diagnosis not present

## 2021-02-24 DIAGNOSIS — Z91040 Latex allergy status: Secondary | ICD-10-CM | POA: Diagnosis not present

## 2021-02-24 DIAGNOSIS — K8050 Calculus of bile duct without cholangitis or cholecystitis without obstruction: Secondary | ICD-10-CM | POA: Diagnosis not present

## 2021-02-24 DIAGNOSIS — K838 Other specified diseases of biliary tract: Secondary | ICD-10-CM | POA: Diagnosis not present

## 2021-02-24 DIAGNOSIS — Z9079 Acquired absence of other genital organ(s): Secondary | ICD-10-CM | POA: Diagnosis not present

## 2021-02-24 LAB — COMPREHENSIVE METABOLIC PANEL
ALT: 416 U/L — ABNORMAL HIGH (ref 0–44)
AST: 616 U/L — ABNORMAL HIGH (ref 15–41)
Albumin: 3.7 g/dL (ref 3.5–5.0)
Alkaline Phosphatase: 256 U/L — ABNORMAL HIGH (ref 38–126)
Anion gap: 13 (ref 5–15)
BUN: 13 mg/dL (ref 6–20)
CO2: 27 mmol/L (ref 22–32)
Calcium: 9.2 mg/dL (ref 8.9–10.3)
Chloride: 97 mmol/L — ABNORMAL LOW (ref 98–111)
Creatinine, Ser: 0.79 mg/dL (ref 0.44–1.00)
GFR, Estimated: >60 mL/min (ref >60)
Glucose, Bld: 162 mg/dL — ABNORMAL HIGH (ref 70–99)
Potassium: 3.5 mmol/L (ref 3.5–5.1)
Sodium: 137 mmol/L (ref 135–145)
Total Bilirubin: 2 mg/dL — ABNORMAL HIGH (ref 0.3–1.2)
Total Protein: 8 g/dL (ref 6.5–8.1)

## 2021-02-24 LAB — CBC
HCT: 39 % (ref 36.0–46.0)
Hemoglobin: 13.8 g/dL (ref 12.0–15.0)
MCH: 29.1 pg (ref 26.0–34.0)
MCHC: 35.4 g/dL (ref 30.0–36.0)
MCV: 82.3 fL (ref 80.0–100.0)
Platelets: 412 10*3/uL — ABNORMAL HIGH (ref 150–400)
RBC: 4.74 MIL/uL (ref 3.87–5.11)
RDW: 12.2 % (ref 11.5–15.5)
WBC: 11.3 10*3/uL — ABNORMAL HIGH (ref 4.0–10.5)
nRBC: 0 % (ref 0.0–0.2)

## 2021-02-24 LAB — URINALYSIS, COMPLETE (UACMP) WITH MICROSCOPIC
Bilirubin Urine: NEGATIVE
Glucose, UA: 50 mg/dL — AB
Hgb urine dipstick: NEGATIVE
Ketones, ur: 20 mg/dL — AB
Leukocytes,Ua: NEGATIVE
Nitrite: NEGATIVE
Protein, ur: 100 mg/dL — AB
Specific Gravity, Urine: 1.024 (ref 1.005–1.030)
pH: 5 (ref 5.0–8.0)

## 2021-02-24 LAB — LIPASE, BLOOD: Lipase: 53 U/L — ABNORMAL HIGH (ref 11–51)

## 2021-02-24 LAB — POC URINE PREG, ED: Preg Test, Ur: NEGATIVE

## 2021-02-24 NOTE — Telephone Encounter (Signed)
Patient has an appointment with you Wednesday for abdominal pain & I called to triage. Patient stated that last week she felt that she had food poisoning, but only experienced throwing up. She felt better Saturday & was able to eat lightly. She has been eating lightly since. She did not have diarrhea & feels that she is actually constipated. She couldn't remember the last time that she had an actual good BM. She said that she has taken a total of three Doculax & only was able to excrete a small amount of stool. She said that this is more like an intestinal soreness & feels bloated/distended. There is no acute, doubling over type pain or fever. She feels that she needs to be "cleaned out". She did not want to be seen sooner by anyone else here, but you. I advised that she be seen sooner by UC if she develop worsening or acute pain. Pt verbalized understanding.

## 2021-02-24 NOTE — ED Triage Notes (Signed)
Pt in with co generalized abd pain since last week. Also co n.v.d. since pain started, was sent here from urgent care.

## 2021-02-24 NOTE — Telephone Encounter (Signed)
Call pt With constipation, I would not wait. She may need abdominal XR, digital exam, abdominal exam  I would advise UC and not waiting until Wednesday.

## 2021-02-25 ENCOUNTER — Inpatient Hospital Stay: Payer: 59

## 2021-02-25 ENCOUNTER — Emergency Department: Payer: 59

## 2021-02-25 ENCOUNTER — Inpatient Hospital Stay: Payer: 59 | Admitting: Anesthesiology

## 2021-02-25 ENCOUNTER — Encounter
Admission: EM | Disposition: A | Discharge status: Home / Self Care | Payer: Self-pay | Source: Home / Self Care | Attending: Internal Medicine

## 2021-02-25 ENCOUNTER — Encounter: Payer: Self-pay | Admitting: Internal Medicine

## 2021-02-25 ENCOUNTER — Inpatient Hospital Stay
Admission: EM | Admit: 2021-02-25 | Discharge: 2021-03-01 | DRG: 419 | Disposition: A | Discharge status: Home / Self Care | Payer: 59 | Attending: Surgery | Admitting: Surgery

## 2021-02-25 ENCOUNTER — Encounter: Payer: Self-pay | Admitting: Family

## 2021-02-25 DIAGNOSIS — K8050 Calculus of bile duct without cholangitis or cholecystitis without obstruction: Secondary | ICD-10-CM | POA: Diagnosis present

## 2021-02-25 DIAGNOSIS — E669 Obesity, unspecified: Secondary | ICD-10-CM

## 2021-02-25 DIAGNOSIS — K219 Gastro-esophageal reflux disease without esophagitis: Secondary | ICD-10-CM | POA: Diagnosis present

## 2021-02-25 DIAGNOSIS — R7401 Elevation of levels of liver transaminase levels: Secondary | ICD-10-CM | POA: Diagnosis present

## 2021-02-25 DIAGNOSIS — F4323 Adjustment disorder with mixed anxiety and depressed mood: Secondary | ICD-10-CM | POA: Diagnosis present

## 2021-02-25 DIAGNOSIS — K810 Acute cholecystitis: Secondary | ICD-10-CM | POA: Diagnosis present

## 2021-02-25 DIAGNOSIS — R1010 Upper abdominal pain, unspecified: Secondary | ICD-10-CM

## 2021-02-25 DIAGNOSIS — R748 Abnormal levels of other serum enzymes: Secondary | ICD-10-CM

## 2021-02-25 HISTORY — PX: CHOLECYSTECTOMY: SHX55

## 2021-02-25 HISTORY — PX: ERCP: SHX5425

## 2021-02-25 LAB — RESP PANEL BY RT-PCR (FLU A&B, COVID) ARPGX2
Influenza A by PCR: NEGATIVE
Influenza B by PCR: NEGATIVE
SARS Coronavirus 2 by RT PCR: NEGATIVE

## 2021-02-25 LAB — APTT: aPTT: 27 seconds (ref 24–36)

## 2021-02-25 LAB — HEPATITIS PANEL, ACUTE
HCV Ab: NONREACTIVE
Hep A IgM: NONREACTIVE
Hep B C IgM: NONREACTIVE
Hepatitis B Surface Ag: NONREACTIVE

## 2021-02-25 LAB — PROTIME-INR
INR: 1.1 (ref 0.8–1.2)
Prothrombin Time: 13.7 seconds (ref 11.4–15.2)

## 2021-02-25 LAB — HIV ANTIBODY (ROUTINE TESTING W REFLEX): HIV Screen 4th Generation wRfx: NONREACTIVE

## 2021-02-25 SURGERY — ERCP, WITH INTERVENTION IF INDICATED
Anesthesia: General

## 2021-02-25 MED ORDER — LIDOCAINE HCL (CARDIAC) PF 100 MG/5ML IV SOSY
PREFILLED_SYRINGE | INTRAVENOUS | Status: DC | PRN
  Administered 2021-02-25: 100 mg via INTRAVENOUS

## 2021-02-25 MED ORDER — LACTATED RINGERS IV SOLN: INTRAVENOUS | Status: DC

## 2021-02-25 MED ORDER — GADOBUTROL 1 MMOL/ML IV SOLN
7.5000 mL | Freq: Once | INTRAVENOUS | Status: AC | PRN
  Administered 2021-02-25: 7.5 mL via INTRAVENOUS

## 2021-02-25 MED ORDER — LACTATED RINGERS IV BOLUS
1000.0000 mL | Freq: Once | INTRAVENOUS | Status: AC
  Administered 2021-02-25: 1000 mL via INTRAVENOUS

## 2021-02-25 MED ORDER — PROPOFOL 500 MG/50ML IV EMUL
INTRAVENOUS | Status: DC | PRN
  Administered 2021-02-25: 200 ug/kg/min via INTRAVENOUS

## 2021-02-25 MED ORDER — IBUPROFEN 400 MG PO TABS
200.0000 mg | ORAL_TABLET | Freq: Four times a day (QID) | ORAL | Status: DC | PRN
  Administered 2021-02-26: 200 mg via ORAL
  Filled 2021-02-25: qty 1

## 2021-02-25 MED ORDER — ADULT MULTIVITAMIN W/MINERALS CH
1.0000 | ORAL_TABLET | Freq: Every day | ORAL | Status: DC
  Administered 2021-02-25 – 2021-03-01 (×5): 1 via ORAL
  Filled 2021-02-25 (×5): qty 1

## 2021-02-25 MED ORDER — SODIUM CHLORIDE 0.9 % IV SOLN: INTRAVENOUS | Status: DC | PRN

## 2021-02-25 MED ORDER — PIPERACILLIN-TAZOBACTAM 3.375 G IVPB
3.3750 g | Freq: Three times a day (TID) | INTRAVENOUS | Status: DC
  Administered 2021-02-25 – 2021-02-27 (×6): 3.375 g via INTRAVENOUS
  Filled 2021-02-25 (×6): qty 50

## 2021-02-25 MED ORDER — PROPOFOL 10 MG/ML IV BOLUS
INTRAVENOUS | Status: DC | PRN
  Administered 2021-02-25: 50 mg via INTRAVENOUS

## 2021-02-25 MED ORDER — VITAMIN D 25 MCG (1000 UNIT) PO TABS
1000.0000 [IU] | ORAL_TABLET | Freq: Every day | ORAL | Status: DC
  Administered 2021-02-25 – 2021-03-01 (×5): 1000 [IU] via ORAL
  Filled 2021-02-25 (×5): qty 1

## 2021-02-25 MED ORDER — PANTOPRAZOLE SODIUM 20 MG PO TBEC
20.0000 mg | DELAYED_RELEASE_TABLET | Freq: Every day | ORAL | Status: DC
  Administered 2021-02-25 – 2021-02-27 (×3): 20 mg via ORAL
  Filled 2021-02-25 (×3): qty 1

## 2021-02-25 MED ORDER — ONDANSETRON HCL 4 MG/2ML IJ SOLN
4.0000 mg | Freq: Once | INTRAMUSCULAR | Status: AC
  Administered 2021-02-25: 4 mg via INTRAVENOUS
  Filled 2021-02-25: qty 2

## 2021-02-25 MED ORDER — ONDANSETRON HCL 4 MG/2ML IJ SOLN: 4.0000 mg | Freq: Three times a day (TID) | INTRAMUSCULAR | Status: DC | PRN

## 2021-02-25 MED ORDER — PIPERACILLIN-TAZOBACTAM 3.375 G IVPB 30 MIN
3.3750 g | Freq: Once | INTRAVENOUS | Status: AC
  Administered 2021-02-25: 3.375 g via INTRAVENOUS
  Filled 2021-02-25: qty 50

## 2021-02-25 MED ORDER — FLUOXETINE HCL 20 MG PO CAPS
40.0000 mg | ORAL_CAPSULE | Freq: Every day | ORAL | Status: DC
  Administered 2021-02-25 – 2021-03-01 (×5): 40 mg via ORAL
  Filled 2021-02-25 (×6): qty 2

## 2021-02-25 MED ORDER — INDOMETHACIN 50 MG RE SUPP
RECTAL | Status: AC
  Filled 2021-02-25: qty 2

## 2021-02-25 MED ORDER — MORPHINE SULFATE (PF) 2 MG/ML IV SOLN
2.0000 mg | INTRAVENOUS | Status: DC | PRN
  Administered 2021-02-27 – 2021-02-28 (×5): 2 mg via INTRAVENOUS
  Filled 2021-02-25 (×5): qty 1

## 2021-02-25 MED ORDER — KETOROLAC TROMETHAMINE 30 MG/ML IJ SOLN
15.0000 mg | Freq: Once | INTRAMUSCULAR | Status: AC
  Administered 2021-02-25: 15 mg via INTRAVENOUS
  Filled 2021-02-25: qty 1

## 2021-02-25 MED ORDER — INDOMETHACIN 50 MG RE SUPP
100.0000 mg | Freq: Once | RECTAL | Status: AC
  Administered 2021-02-25: 100 mg via RECTAL

## 2021-02-25 MED ORDER — ACETAMINOPHEN 650 MG RE SUPP: 650.0000 mg | Freq: Four times a day (QID) | RECTAL | Status: DC | PRN

## 2021-02-25 MED ORDER — CALCIUM CARBONATE 1250 (500 CA) MG PO TABS
1.0000 | ORAL_TABLET | Freq: Every day | ORAL | Status: DC
  Administered 2021-02-25 – 2021-03-01 (×5): 500 mg via ORAL
  Filled 2021-02-25 (×5): qty 1

## 2021-02-25 NOTE — H&P (Signed)
History and Physical    Jamie Gordon WGN:[id number] DOB: [birth year] DOA: 02/25/2021  Referring MD/NP/PA:   PCP: Allegra Grana, FNP   Patient coming from:  The patient is coming from home.  At baseline, pt is independent for most of ADL.        Chief Complaint: Nausea, vomiting, diarrhea, abdominal pain.  HPI: Jamie Gordon is a 45 y.o. female with medical history significant of depression, anxiety, adjustment disorder, endometriosis, ectopic pregnancy, miscarriage, who presents with nausea, vomiting, diarrhea and abdominal pain.  Patient states her symptoms has been going on for almost a week, including nausea, vomiting, diarrhea and abdominal pain.  She has had few times of nonbilious nonbloody vomiting in the past several days, which has improved.  He did not have vomiting today.  She also has had few times of loose stool bowel movement which has also improved.  Denies fever or chills.  Abdominal pain is located in upper abdomen, initially 10 out of 10 severity, currently 1 out of 10 severity, sharp, nonradiating.  No symptoms of UTI.  Denies chest pain, cough, shortness breath.  ED Course: pt was found to have WBC 11.3, negative pregnancy test, lipase 53, pending COVID-19 PCR, urinalysis (hazy appearance, negative leukocyte, rare bacteria, WBC 6-10), abnormal liver function (ALP 256, AST 6016, ALT 416, total bilirubin 2.0), temperature normal, blood pressure 111/74, heart rate 8, RR 20, oxygen saturation 95% on room air.  Patient is admitted to MedSurg bed as inpatient.  GI (Dr. Maximino Greenland will coordinate with Dr. Daleen Squibb) and Dr. Tonna Boehringer of general surgeon are consulted.   US-RUQ: 1. Gallbladder sludge with a mildly distended common bile duct. Sludge noted within the lumen. 2. No acute cholecystitis.  MRCP: Distended gallbladder with layering gallbladder sludge and mild pericholecystic inflammatory changes. This raises concern for very early acute cholecystitis.  Dilated common  duct, measuring 10 mm, with mild sludge and suspected 3 mm distal CBD stone at the ampulla. If surgical intervention is not performed, consider ERCP  Review of Systems:   General: no fevers, chills, no body weight gain, has poor appetite, has fatigue HEENT: no blurry vision, hearing changes or sore throat Respiratory: no dyspnea, coughing, wheezing CV: no chest pain, no palpitations GI: has nausea, vomiting, abdominal pain, diarrhea, no constipation GU: no dysuria, burning on urination, increased urinary frequency, hematuria  Ext: no leg edema Neuro: no unilateral weakness, numbness, or tingling, no vision change or hearing loss Skin: no rash, no skin tear. MSK: No muscle spasm, no deformity, no limitation of range of movement in spin Heme: No easy bruising.  Travel history: No recent long distant travel.  Allergy:  Allergies  Allergen Reactions  . Latex Itching    Past Medical History:  Diagnosis Date  . Ectopic pregnancy    s/p left partial salpingectomy  . Endometriosis   . Female fertility problem   . Habitual abortion history, antepartum   . Miscarriage    x2 at 5 weeks    Past Surgical History:  Procedure Laterality Date  . ECTOPIC PREGNANCY SURGERY  02/2013   Dr. Greggory Keen  . WISDOM TOOTH EXTRACTION      Social History:  reports that she has never smoked. She has never used smokeless tobacco. She reports that she does not drink alcohol and does not use drugs.  Family History:  Family History  Problem Relation Age of Onset  . Hyperlipidemia Mother   . Cancer Maternal Grandmother  ovarian  . Heart disease Maternal Grandmother   . Arthritis Father   . Hypertension Father   . Ovarian cancer Maternal Grandfather   . Diabetes Neg Hx   . Colon cancer Neg Hx   . Breast cancer Neg Hx      Prior to Admission medications   Medication Sig Start Date End Date Taking? Authorizing Provider  FLUoxetine (PROZAC) 40 MG capsule TAKE 1 CAPSULE BY MOUTH DAILY.  11/08/20 11/08/21  Allegra Grana, FNP  Prenatal MV-Min-Fe Fum-FA-DHA (PRENATAL 1 PO) Take by mouth.    [provider]    Physical Exam: Vitals:   02/25/21 1231 02/25/21 1241 02/25/21 1251 02/25/21 1446  BP: 95/84 104/63 105/71 112/71  Pulse: 87 82 87 73  Resp: 16 15 (!) 21 18  Temp:    99.1 F (37.3 C)  TempSrc:    Oral  SpO2: 93% 96% 99% 98%  Weight:      Height:       General: Not in acute distress HEENT:       Eyes: PERRL, EOMI, no scleral icterus.       ENT: No discharge from the ears and nose, no pharynx injection, no tonsillar enlargement.        Neck: No JVD, no bruit, no mass felt. Heme: No neck lymph node enlargement. Cardiac: S1/S2, RRR, No murmurs, No gallops or rubs. Respiratory: No rales, wheezing, rhonchi or rubs. GI: Soft, nondistended, has tenderness in upper abdomen, no rebound pain, no organomegaly, BS present. GU: No hematuria Ext: No pitting leg edema bilaterally. 1+DP/PT pulse bilaterally. Musculoskeletal: No joint deformities, No joint redness or warmth, no limitation of ROM in spin. Skin: No rashes.  Neuro: Alert, oriented X3, cranial nerves II-XII grossly intact, moves all extremities normally.  Psych: Patient is not psychotic, no suicidal or hemocidal ideation.  Labs on Admission: I have personally reviewed following labs and imaging studies  CBC: Recent Labs  Lab 02/24/21 2226  WBC 11.3*  HGB 13.8  HCT 39.0  MCV 82.3  PLT 412*   Basic Metabolic Panel: Recent Labs  Lab 02/24/21 2226  NA 137  K 3.5  CL 97*  CO2 27  GLUCOSE 162*  BUN 13  CREATININE 0.79  CALCIUM 9.2   GFR: Estimated Creatinine Clearance: 97.9 mL/min (by C-G formula based on SCr of 0.79 mg/dL). Liver Function Tests: Recent Labs  Lab 02/24/21 2226  AST 616*  ALT 416*  ALKPHOS 256*  BILITOT 2.0*  PROT 8.0  ALBUMIN 3.7   Recent Labs  Lab 02/24/21 2226  LIPASE 53*   No results for input(s): AMMONIA in the last 168 hours. Coagulation  Profile: Recent Labs  Lab 02/25/21 0944  INR 1.1   Cardiac Enzymes: No results for input(s): CKTOTAL, CKMB, CKMBINDEX, TROPONINI in the last 168 hours. BNP (last 3 results) No results for input(s): PROBNP in the last 8760 hours. HbA1C: No results for input(s): HGBA1C in the last 72 hours. CBG: No results for input(s): GLUCAP in the last 168 hours. Lipid Profile: No results for input(s): CHOL, HDL, LDLCALC, TRIG, CHOLHDL, LDLDIRECT in the last 72 hours. Thyroid Function Tests: No results for input(s): TSH, T4TOTAL, FREET4, T3FREE, THYROIDAB in the last 72 hours. Anemia Panel: No results for input(s): VITAMINB12, FOLATE, FERRITIN, TIBC, IRON, RETICCTPCT in the last 72 hours. Urine analysis:    Component Value Date/Time   COLORURINE YELLOW (A) 02/24/2021 2226   APPEARANCEUR HAZY (A) 02/24/2021 2226   LABSPEC 1.024 02/24/2021 2226   PHURINE  5.0 02/24/2021 2226   GLUCOSEU 50 (A) 02/24/2021 2226   HGBUR NEGATIVE 02/24/2021 2226   BILIRUBINUR NEGATIVE 02/24/2021 2226   BILIRUBINUR Negative 08/12/2016 1451   KETONESUR 20 (A) 02/24/2021 2226   PROTEINUR 100 (A) 02/24/2021 2226   UROBILINOGEN 0.2 08/12/2016 1451   NITRITE NEGATIVE 02/24/2021 2226   LEUKOCYTESUR NEGATIVE 02/24/2021 2226   Sepsis Labs: @LABRCNTIP (procalcitonin:4,lacticidven:4) ) Recent Results (from the past 240 hour(s))  Resp Panel by RT-PCR (Flu A&B, Covid) Nasopharyngeal Swab     Status: None   Collection Time: 02/25/21  9:07 AM   Specimen: Nasopharyngeal Swab; Nasopharyngeal(NP) swabs in vial transport medium  Result Value Ref Range Status   SARS Coronavirus 2 by RT PCR NEGATIVE NEGATIVE Final    Comment: (NOTE) SARS-CoV-2 target nucleic acids are NOT DETECTED.  The SARS-CoV-2 RNA is generally detectable in upper respiratory specimens during the acute phase of infection. The lowest concentration of SARS-CoV-2 viral copies this assay can detect is 138 copies/mL. A negative result does not preclude  SARS-Cov-2 infection and should not be used as the sole basis for treatment or other patient management decisions. A negative result may occur with  improper specimen collection/handling, submission of specimen other than nasopharyngeal swab, presence of viral mutation(s) within the areas targeted by this assay, and inadequate number of viral copies(<138 copies/mL). A negative result must be combined with clinical observations, patient history, and epidemiological information. The expected result is Negative.  Fact Sheet for Patients:  04/27/21  Fact Sheet for Healthcare Providers:  BloggerCourse.com  This test is no t yet approved or cleared by the [employer] FDA and  has been authorized for detection and/or diagnosis of SARS-CoV-2 by FDA under an Emergency Use Authorization (EUA). This EUA will remain  in effect (meaning this test can be used) for the duration of the COVID-19 declaration under Section 564(b)(1) of the Act, 21 U.S.C.section 360bbb-3(b)(1), unless the authorization is terminated  or revoked sooner.       Influenza A by PCR NEGATIVE NEGATIVE Final   Influenza B by PCR NEGATIVE NEGATIVE Final    Comment: (NOTE) The Xpert Xpress SARS-CoV-2/FLU/RSV plus assay is intended as an aid in the diagnosis of influenza from Nasopharyngeal swab specimens and should not be used as a sole basis for treatment. Nasal washings and aspirates are unacceptable for Xpert Xpress SARS-CoV-2/FLU/RSV testing.  Fact Sheet for Patients: [place]  Fact Sheet for Healthcare Providers: BloggerCourse.com  This test is not yet approved or cleared by the [employer].[place] FDA and has been authorized for detection and/or diagnosis of SARS-CoV-2 by FDA under an Emergency Use Authorization (EUA). This EUA will remain in effect (meaning this test can be used) for the duration of  the COVID-19 declaration under Section 564(b)(1) of the Act, 21 U.S.C. section 360bbb-3(b)(1), unless the authorization is terminated or revoked.  Performed at [facility], [address] [place], [place]      Radiological Exams on Admission: DG C-Arm 1-60 Min-No Report  Result Date: 02/25/2021 Fluoroscopy was utilized by the requesting physician.  No radiographic interpretation.   MR ABDOMEN MRCP W WO CONTAST  Result Date: 02/25/2021 CLINICAL DATA:  Abdominal pain, elevated LFTs, evaluate for biliary obstruction EXAM: MRI ABDOMEN WITHOUT AND WITH CONTRAST (INCLUDING MRCP) TECHNIQUE: Multiplanar multisequence MR imaging of the abdomen was performed both before and after the administration of intravenous contrast. Heavily T2-weighted images of the biliary and pancreatic ducts were obtained, and three-dimensional MRCP images were rendered by post processing. CONTRAST:  7.82mL GADAVIST GADOBUTROL 1 MMOL/ML IV SOLN COMPARISON:  Right upper quadrant ultrasound dated 02/25/2021 FINDINGS: Lower chest: Lung bases are clear. Hepatobiliary: No morphologic findings of cirrhosis. No hepatic steatosis. 15 mm cyst in segment 4A (series 4/image 11), benign. Distended gallbladder with very mild irregular gallbladder wall thickening, layering gallbladder sludge, and mild pericholecystic inflammatory changes (series 4/image 23). Dilated common duct, measuring 10 mm. Mild gallbladder sludge in the distal CBD (series 4/image 23). Although poorly visualized, there is a suspected 3 mm distal CBD stone at the ampulla (series 19/image 7). Pancreas:  Within normal limits. Spleen:  Within normal limits. Adrenals/Urinary Tract:  Adrenal glands are within normal limits. Kidneys are within normal limits.  No hydronephrosis. Stomach/Bowel: Stomach is notable for a tiny hiatal hernia. Visualized bowel is grossly unremarkable. Vascular/Lymphatic:  No evidence of abdominal aortic aneurysm. No suspicious abdominal  lymphadenopathy. Other:  No abdominal ascites. Musculoskeletal: No focal osseous lesions. IMPRESSION: Distended gallbladder with layering gallbladder sludge and mild pericholecystic inflammatory changes. This raises concern for very early acute cholecystitis. Dilated common duct, measuring 10 mm, with mild sludge and suspected 3 mm distal CBD stone at the ampulla. If surgical intervention is not performed, consider ERCP. Electronically Signed   By: Charline Bills M.D.   On: 02/25/2021 08:36   US ABDOMEN LIMITED RUQ (LIVER/GB)  Result Date: 02/25/2021 CLINICAL DATA:  Transaminitis. EXAM: ULTRASOUND ABDOMEN LIMITED RIGHT UPPER QUADRANT COMPARISON:  None. FINDINGS: Gallbladder: Sludge within the gallbladder lumen. Slightly hydropic gallbladder. No gallstones or wall thickening visualized. No sonographic Murphy sign noted by sonographer. Common bile duct: Diameter: 8 mm. Liver: Thinly septated 1.4 x 1.5 x 1.6 cm cystic lesion within the right hepatic lobe. Otherwise no focal lesion identified. Within normal limits in parenchymal echogenicity. Portal vein is patent on color Doppler imaging with normal direction of blood flow towards the liver. Other: None. IMPRESSION: 1. Gallbladder sludge with a mildly distended common bile duct. Sludge noted within the lumen. 2. No acute cholecystitis. Electronically Signed   By: Tish Frederickson M.D.   On: 02/25/2021 04:19     EKG: Reviewed independently, sinus rhythm, QTC 470, low voltage, nonspecific T wave change  Assessment/Plan Principal Problem:   Choledocholithiasis Active Problems:   Adjustment disorder with mixed anxiety and depressed mood   Transaminitis   Acute cholecystitis   GERD (gastroesophageal reflux disease)   Choledocholithiasis and acute cholecystitis: Dr. Maximino Greenland of GI is consulted.  She coordinated with Dr. Servando Snare and had ERCP performed with removal of CBD stone. Per Dr. Michele Mcalpine note, "as per Dr. Annabell Sabal verbal report to me, pus was seen  extruding from the bile duct during the ERCP. therefore, antibiotics would be recommended given this finding". Dr.Sakai of surgery is consulted.  Planning to do cholecystectomy on 6/9.  -Admit to MedSurg bed as inpatient -As needed morphine for pain, Zofran for nausea -IV Zosyn -Follow-up blood culture -Clear liquid diet  Transaminitis -Avoid using Tylenol -CMP pneumonia  Adjustment disorder with mixed anxiety and depressed mood -Prozac  GERD: -protonix     DVT ppx: SCD Code Status: Full code Family Communication:   Yes, patient's husband at bed side Disposition Plan:  Anticipate discharge back to previous environment Consults called:   GI (Dr. Maximino Greenland will coordinate with Dr. Daleen Squibb) and Dr. Tonna Boehringer of general surgeon are consulted Admission status and Level of care: Med-Surg:   as inpt   Status is: Inpatient  Remains inpatient appropriate because:Inpatient level of care appropriate due to severity of illness   Dispo:  The patient is from: Home              Anticipated d/c is to: Home              Patient currently is not medically stable to d/c.   Difficult to place patient No        Date of Service 02/25/2021    Lorretta HarpXilin Baylee Mccorkel [facility]   If 7PM-7AM, please contact night-coverage [email or web address] 02/25/2021, 6:21 PM

## 2021-02-25 NOTE — Op Note (Addendum)
[facility] Patient Name: Angelis Nero Procedure Date: 02/25/2021 11:26 AM MRN: [id number] Account #: [id number] Date of Birth: 29-May-1976 Admit Type: Inpatient Age: 45 Room: [facility] ENDO ROOM 4 Gender: Female Note Status: Finalized Procedure:             ERCP Indications:           Common bile duct stone(s), Bile duct stone on magnetic                         resonance cholangiopancreatography Providers:             Midge Minium MD, MD Medicines:             Propofol per Anesthesia Complications:         No immediate complications. Procedure:             Pre-Anesthesia Assessment:                        - Prior to the procedure, a History and Physical was                         performed, and patient medications and allergies were                         reviewed. The patient's tolerance of previous                         anesthesia was also reviewed. The risks and benefits                         of the procedure and the sedation options and risks                         were discussed with the patient. All questions were                         answered, and informed consent was obtained. Prior                         Anticoagulants: The patient has taken no previous                         anticoagulant or antiplatelet agents. ASA Grade                         Assessment: II - A patient with mild systemic disease.                         After reviewing the risks and benefits, the patient                         was deemed in satisfactory condition to undergo the                         procedure.                        After obtaining informed consent, the scope was passed  under direct vision. Throughout the procedure, the                         patient's blood pressure, pulse, and oxygen                         saturations were monitored continuously. The [employer] D single  use duodenoscope was                         introduced through the mouth, and used to inject                         contrast into and used for direct visualization of the                         bile duct. The ERCP was accomplished without                         difficulty. The patient tolerated the procedure well. Findings:      The scout film was normal. The esophagus was successfully intubated       under direct vision. The scope was advanced to a normal major papilla in       the descending duodenum without detailed examination of the pharynx,       larynx and associated structures, and upper GI tract. The upper GI tract       was grossly normal. The bile duct was deeply cannulated with the       short-nosed traction sphincterotome. Contrast was injected. I personally       interpreted the bile duct images. There was brisk flow of contrast       through the ducts. Image quality was excellent. Contrast extended to the       entire biliary tree. The common bile duct contained one stone. A wire       was passed into the biliary tree. A 7 mm biliary sphincterotomy was made       with a traction (standard) sphincterotome using ERBE electrocautery. The       sphincterotomy oozed blood. The biliary tree was swept with a 15 mm       balloon starting at the bifurcation. One stone was removed. No stones       remained. Impression:            - Choledocholithiasis was found. Complete removal was                         accomplished by biliary sphincterotomy and balloon                         extraction.                        - A biliary sphincterotomy was performed.                        - The biliary tree was swept. Recommendation:        - Return patient to hospital ward  for ongoing care.                        - Clear liquid diet today. Procedure Code(s):     --- Professional ---                        [phone], Endoscopic retrograde cholangiopancreatography                          (ERCP); with removal of calculi/debris from                         biliary/pancreatic duct(s)                        [id number], Endoscopic retrograde cholangiopancreatography                         (ERCP); with sphincterotomy/papillotomy                        [phone], Endoscopic catheterization of the biliary                         ductal system, radiological supervision and                         interpretation Diagnosis Code(s):     --- Professional ---                        K80.50, Calculus of bile duct without cholangitis or                         cholecystitis without obstruction CPT copyright 2019 American [employer] Association. All rights reserved. The codes documented in this report are preliminary and upon coder review may  be revised to meet current compliance requirements. Midge Minium MD, MD 02/25/2021 12:07:02 PM This report has been signed electronically. Number of Addenda: 0 Note Initiated On: 02/25/2021 11:26 AM Estimated Blood Loss:  Estimated blood loss: none.      [facility]

## 2021-02-25 NOTE — ED Provider Notes (Signed)
Nice care this patient approximately 0 700.  Please have providers note for full details regarding patient's initial evaluation assessment.  In brief patient presents for assessment of some abdominal pain associate with vomiting diarrhea.  On arrival she was found to be hemodynamically stable.  CBC with WBC count of 11.3 and otherwise unremarkable.  CMP with AST of 616, ALT of 416 and alk phos of 236 with a T bili of 2.  Lipase 33.  Urinalysis not suggestive of UTI.  Pregnancy test is negative.  Right upper quadrant ultrasound obtained shows gallbladder sludge with mild distended common bile duct.  No acute cholecystitis  Surgery consulted overnight.  Plan is to obtain MRCP.  Patient is pain-free at time of signout.  If patient has choledocholithiasis she will acquire admission for ERCP.  If she has no choledocholithiasis and her pain returns she will be admitted for likely surgery.  If she is still pain-free and she does not have choledocholithiasis she will likely be able to be discharged home with outpatient follow-up.  MRCP does show stones in the duct with some dilation and sludge as well as likely early acute cholecystitis.  Surgery and GI aware.  We will give a dose of antibiotics and admit to medicine.  Medications  piperacillin-tazobactam (ZOSYN) IVPB 3.375 g (3.375 g Intravenous New Bag/Given 02/25/21 0927)  ketorolac (TORADOL) 30 MG/ML injection 15 mg (15 mg Intravenous Given 02/25/21 0213)  ondansetron (ZOFRAN) injection 4 mg (4 mg Intravenous Given 02/25/21 0213)  lactated ringers bolus 1,000 mL (0 mLs Intravenous Stopped 02/25/21 0439)  lactated ringers bolus 1,000 mL (0 mLs Intravenous Stopped 02/25/21 0728)  gadobutrol (GADAVIST) 1 MMOL/ML injection 7.5 mL (7.5 mLs Intravenous Contrast Given 02/25/21 0825)      Lucrezia Starch, MD 02/25/21 [phone]

## 2021-02-25 NOTE — ED Notes (Signed)
US at bedside

## 2021-02-25 NOTE — Consult Note (Signed)
Jamie Bouillon, MD [address], [place], [place], [address] [address], [address], [place], [place], [address] Phone: [phone]  Fax: [phone]  Consultation  Referring Provider:     Dr. Antoine Gordon Primary Care Physician:  Jamie Grana, FNP Reason for Consultation:    Choledocholithiasis  Date of Admission:  02/25/2021 Date of Consultation:  02/25/2021         HPI:   Jamie Gordon is a 45 y.o. female With 1 week history of epigastric abdominal pain, 10 out of 10, sharp, nonradiating.  I evaluated the patient post ERCP today and ERCP was done for removal of CBD stone.  Patient reports complete resolution of abdominal pain postprocedure.  No prior history of similar symptoms.  Denies any fever or chills.  On presentation, liver enzymes were elevated and imaging showed dilated common duct, measuring 10 mm, with sludge and CBD stone, with findings of acute cholecystitis as well.  Past Medical History:  Diagnosis Date  . Ectopic pregnancy    s/p left partial salpingectomy  . Endometriosis   . Female fertility problem   . Habitual abortion history, antepartum   . Miscarriage    x2 at 5 weeks    Past Surgical History:  Procedure Laterality Date  . ECTOPIC PREGNANCY SURGERY  02/2013   Dr. Greggory Keen  . WISDOM TOOTH EXTRACTION      Prior to Admission medications   Medication Sig Start Date End Date Taking? Authorizing Provider  calcium carbonate (OS-CAL - DOSED IN MG OF ELEMENTAL CALCIUM) 1250 (500 Ca) MG tablet Take 1 tablet by mouth daily.   Yes [provider]  cholecalciferol (VITAMIN D3) 25 MCG (1000 UNIT) tablet Take 1,000 Units by mouth daily.   Yes [provider]  esomeprazole (NEXIUM) 20 MG capsule Take 20 mg by mouth daily as needed (acid reflux symptoms).   Yes [provider]  FLUoxetine (PROZAC) 40 MG capsule TAKE 1 CAPSULE BY MOUTH DAILY. Patient taking differently: Take 40 mg by mouth daily. 11/08/20 11/08/21  Yes Arnett, Lyn Records, FNP  Multiple Vitamins-Minerals (MULTIVITAMIN WITH MINERALS) tablet Take 1 tablet by mouth daily.   Yes [provider]    Family History  Problem Relation Age of Onset  . Hyperlipidemia Mother   . Cancer Maternal Grandmother        ovarian  . Heart disease Maternal Grandmother   . Arthritis Father   . Hypertension Father   . Ovarian cancer Maternal Grandfather   . Diabetes Neg Hx   . Colon cancer Neg Hx   . Breast cancer Neg Hx      Social History   Tobacco Use  . Smoking status: Never Smoker  . Smokeless tobacco: Never Used  Vaping Use  . Vaping Use: Never used  Substance Use Topics  . Alcohol use: No  . Drug use: No    Allergies as of 02/24/2021 - Review Complete 02/24/2021  Allergen Reaction Noted  . Latex Itching 06/24/2015    Review of Systems:    All systems reviewed and negative except where noted in HPI.   Physical Exam:  Vital signs in last 24 hours: Vitals:   02/25/21 1231 02/25/21 1241 02/25/21 1251 02/25/21 1446  BP: 95/84 104/63 105/71 112/71  Pulse: 87 82 87 73  Resp: 16 15 (!) 21 18  Temp:    99.1 F (37.3 C)  TempSrc:    Oral  SpO2: 93% 96% 99% 98%  Weight:  Height:         General:   Brisendine, cooperative in NAD Head:  Normocephalic and atraumatic. Eyes:   No icterus.   Conjunctiva pink. PERRLA. Ears:  Normal auditory acuity. Neck:  Supple; no masses or thyroidomegaly Lungs: Respirations even and unlabored. Lungs clear to auscultation bilaterally.   No wheezes, crackles, or rhonchi.  Abdomen:  Soft, nondistended, nontender. Normal bowel sounds. No appreciable masses or hepatomegaly.  No rebound or guarding.  Neurologic:  Alert and oriented x3;  grossly normal neurologically. Skin:  Intact without significant lesions or rashes. Cervical Nodes:  No significant cervical adenopathy. Psych:  Alert and cooperative. Normal affect.  LAB RESULTS: Recent Labs    02/24/21 2226  WBC 11.3*  HGB 13.8  HCT  39.0  PLT 412*   BMET Recent Labs    02/24/21 2226  NA 137  K 3.5  CL 97*  CO2 27  GLUCOSE 162*  BUN 13  CREATININE 0.79  CALCIUM 9.2   LFT Recent Labs    02/24/21 2226  PROT 8.0  ALBUMIN 3.7  AST 616*  ALT 416*  ALKPHOS 256*  BILITOT 2.0*   PT/INR Recent Labs    02/25/21 0944  LABPROT 13.7  INR 1.1    STUDIES: DG C-Arm 1-60 Min-No Report  Result Date: 02/25/2021 Fluoroscopy was utilized by the requesting physician.  No radiographic interpretation.   MR ABDOMEN MRCP W WO CONTAST  Result Date: 02/25/2021 CLINICAL DATA:  Abdominal pain, elevated LFTs, evaluate for biliary obstruction EXAM: MRI ABDOMEN WITHOUT AND WITH CONTRAST (INCLUDING MRCP) TECHNIQUE: Multiplanar multisequence MR imaging of the abdomen was performed both before and after the administration of intravenous contrast. Heavily T2-weighted images of the biliary and pancreatic ducts were obtained, and three-dimensional MRCP images were rendered by post processing. CONTRAST:  7.60mL GADAVIST GADOBUTROL 1 MMOL/ML IV SOLN COMPARISON:  Right upper quadrant ultrasound dated 02/25/2021 FINDINGS: Lower chest: Lung bases are clear. Hepatobiliary: No morphologic findings of cirrhosis. No hepatic steatosis. 15 mm cyst in segment 4A (series 4/image 11), benign. Distended gallbladder with very mild irregular gallbladder wall thickening, layering gallbladder sludge, and mild pericholecystic inflammatory changes (series 4/image 23). Dilated common duct, measuring 10 mm. Mild gallbladder sludge in the distal CBD (series 4/image 23). Although poorly visualized, there is a suspected 3 mm distal CBD stone at the ampulla (series 19/image 7). Pancreas:  Within normal limits. Spleen:  Within normal limits. Adrenals/Urinary Tract:  Adrenal glands are within normal limits. Kidneys are within normal limits.  No hydronephrosis. Stomach/Bowel: Stomach is notable for a tiny hiatal hernia. Visualized bowel is grossly unremarkable.  Vascular/Lymphatic:  No evidence of abdominal aortic aneurysm. No suspicious abdominal lymphadenopathy. Other:  No abdominal ascites. Musculoskeletal: No focal osseous lesions. IMPRESSION: Distended gallbladder with layering gallbladder sludge and mild pericholecystic inflammatory changes. This raises concern for very early acute cholecystitis. Dilated common duct, measuring 10 mm, with mild sludge and suspected 3 mm distal CBD stone at the ampulla. If surgical intervention is not performed, consider ERCP. Electronically Signed   By: Charline Bills M.D.   On: 02/25/2021 08:36   US ABDOMEN LIMITED RUQ (LIVER/GB)  Result Date: 02/25/2021 CLINICAL DATA:  Transaminitis. EXAM: ULTRASOUND ABDOMEN LIMITED RIGHT UPPER QUADRANT COMPARISON:  None. FINDINGS: Gallbladder: Sludge within the gallbladder lumen. Slightly hydropic gallbladder. No gallstones or wall thickening visualized. No sonographic Murphy sign noted by sonographer. Common bile duct: Diameter: 8 mm. Liver: Thinly septated 1.4 x 1.5 x 1.6 cm cystic lesion within the right hepatic lobe.  Otherwise no focal lesion identified. Within normal limits in parenchymal echogenicity. Portal vein is patent on color Doppler imaging with normal direction of blood flow towards the liver. Other: None. IMPRESSION: 1. Gallbladder sludge with a mildly distended common bile duct. Sludge noted within the lumen. 2. No acute cholecystitis. Electronically Signed   By: Tish Frederickson M.D.   On: 02/25/2021 04:19      Impression / Plan:   Jamie Gordon is a 45 y.o. y/o female with epigastric abdominal pain, due to choledocholithiasis and cholecystitis, with complete resolution of abdominal pain symptoms since ERCP today  Patient is now completely asymptomatic after ERCP No evidence of pancreatitis at this time  As per Dr. Annabell Sabal verbal report to me, pus was seen extruding from the bile duct during the ERCP. therefore, antibiotics would be recommended given this  finding  Patient is already on Zosyn which is appropriate coverage  Watch for any signs of pancreatitis  Cholecystectomy is recommended to prevent future episodes of choledocholithiasis, and given findings of cholecystitis on this admission as well  Timing of cholecystectomy deferred to surgical team  Repeat CMP tomorrow morning to ensure it is improving  Continue to monitor clinical symptoms and abdominal exam  Thank you for involving me in the care of this patient.      LOS: 0 days   Pasty Spillers, MD  02/25/2021, 3:47 PM

## 2021-02-25 NOTE — Anesthesia Preprocedure Evaluation (Signed)
Anesthesia Evaluation  Patient identified by MRN, date of birth, ID band Patient awake  General Assessment Comment: Vomiting yesterday, none today.  Reviewed: Allergy & Precautions, NPO status , Patient's Chart, lab work & pertinent test results  History of Anesthesia Complications Negative for: history of anesthetic complications  Airway Mallampati: II  TM Distance: <3 FB Neck ROM: Full    Dental no notable dental hx. (+) Teeth Intact   Pulmonary neg pulmonary ROS, neg sleep apnea, neg COPD, Patient abstained from smoking.Not current smoker,    Pulmonary exam normal breath sounds clear to auscultation       Cardiovascular Exercise Tolerance: Good METS(-) hypertension(-) CAD and (-) Past MI negative cardio ROS  (-) dysrhythmias  Rhythm:Regular Rate:Normal - Systolic murmurs    Neuro/Psych negative neurological ROS  negative psych ROS   GI/Hepatic neg GERD  ,(+)     (-) substance abuse  ,   Endo/Other  neg diabetes  Renal/GU negative Renal ROS     Musculoskeletal   Abdominal   Peds  Hematology   Anesthesia Other Findings Past Medical History: No date: Ectopic pregnancy     Comment:  s/p left partial salpingectomy No date: Endometriosis No date: Female fertility problem No date: Habitual abortion history, antepartum No date: Miscarriage     Comment:  x2 at 5 weeks  Reproductive/Obstetrics                             Anesthesia Physical Anesthesia Plan  ASA: II  Anesthesia Plan: General   Post-op Pain Management:    Induction: Intravenous  PONV Risk Score and Plan: 3 and Ondansetron, Propofol infusion and TIVA  Airway Management Planned: Nasal Cannula  Additional Equipment: None  Intra-op Plan:   Post-operative Plan:   Informed Consent: I have reviewed the patients History and Physical, chart, labs and discussed the procedure including the risks, benefits and  alternatives for the proposed anesthesia with the patient or authorized representative who has indicated his/her understanding and acceptance.     Dental advisory given  Plan Discussed with: CRNA and Surgeon  Anesthesia Plan Comments: (Discussed risks of anesthesia with patient, including possibility of difficulty with spontaneous ventilation under anesthesia necessitating airway intervention, PONV, and rare risks such as cardiac or respiratory or neurological events. Patient understands.)        Anesthesia Quick Evaluation

## 2021-02-25 NOTE — H&P (View-Only) (Signed)
Subjective:   CC: acute cholecystitis  HPI:  Jamie Gordon is a 45 y.o. female who is consulted by Don Perking for evaluation of above cc.  Symptoms were first noted a few days ago. Pain is sharp, RUQ, now resolved.  Associated with nothing specific, exacerbated by nothing specific.  Elevated LFTs alogn with dilated CBD noted, subsequent MRCP showed CBD stone.  She is now s/p ERCP.  No complaints currently      Past Medical History:  has a past medical history of Ectopic pregnancy, Endometriosis, Female fertility problem, Habitual abortion history, antepartum, and Miscarriage.  Past Surgical History:  has a past surgical history that includes Ectopic pregnancy surgery (02/2013) and Wisdom tooth extraction.  Family History: family history includes Arthritis in her father; Cancer in her maternal grandmother; Heart disease in her maternal grandmother; Hyperlipidemia in her mother; Hypertension in her father; Ovarian cancer in her maternal grandfather.  Social History:  reports that she has never smoked. She has never used smokeless tobacco. She reports that she does not drink alcohol and does not use drugs.  Current Medications:  Prior to Admission medications   Medication Sig Start Date End Date Taking? Authorizing Provider  calcium carbonate (OS-CAL - DOSED IN MG OF ELEMENTAL CALCIUM) 1250 (500 Ca) MG tablet Take 1 tablet by mouth daily.   Yes [provider]  cholecalciferol (VITAMIN D3) 25 MCG (1000 UNIT) tablet Take 1,000 Units by mouth daily.   Yes [provider]  esomeprazole (NEXIUM) 20 MG capsule Take 20 mg by mouth daily as needed (acid reflux symptoms).   Yes [provider]  FLUoxetine (PROZAC) 40 MG capsule TAKE 1 CAPSULE BY MOUTH DAILY. Patient taking differently: Take 40 mg by mouth daily. 11/08/20 11/08/21 Yes Arnett, Lyn Records, FNP  Multiple Vitamins-Minerals (MULTIVITAMIN WITH MINERALS) tablet Take 1 tablet by mouth daily.   Yes [provider]    Allergies:  Allergies as of 02/24/2021 - Review Complete 02/24/2021  Allergen Reaction Noted  . Latex Itching 06/24/2015    ROS:  General: Denies weight loss, weight gain, fatigue, fevers, chills, and night sweats. Eyes: Denies blurry vision, double vision, eye pain, itchy eyes, and tearing. Ears: Denies hearing loss, earache, and ringing in ears. Nose: Denies sinus pain, congestion, infections, runny nose, and nosebleeds. Mouth/throat: Denies hoarseness, sore throat, bleeding gums, and difficulty swallowing. Heart: Denies chest pain, palpitations, racing heart, irregular heartbeat, leg pain or swelling, and decreased activity tolerance. Respiratory: Denies breathing difficulty, shortness of breath, wheezing, cough, and sputum. GI: Denies change in appetite, heartburn, nausea, vomiting, constipation, diarrhea, and blood in stool. GU: Denies difficulty urinating, pain with urinating, urgency, frequency, blood in urine. Musculoskeletal: Denies joint stiffness, pain, swelling, muscle weakness. Skin: Denies rash, itching, mass, tumors, sores, and boils Neurologic: Denies headache, fainting, dizziness, seizures, numbness, and tingling. Psychiatric: Denies depression, anxiety, difficulty sleeping, and memory loss. Endocrine: Denies heat or cold intolerance, and increased thirst or urination. Blood/lymph: Denies easy bruising, and swollen glands     Objective:     BP 105/71   Pulse 87   Temp (!) 97.5 F (36.4 C) (Temporal)   Resp (!) 21   Ht 5\' 4"  (1.626 m)   Wt 88.9 kg   LMP 02/12/2021   SpO2 99%   BMI 33.64 kg/m    Constitutional :  alert, cooperative, appears stated age and no distress  Lymphatics/Throat:  no asymmetry, masses, or scars  Respiratory:  clear to auscultation bilaterally  Cardiovascular:  regular rate and  rhythm  Gastrointestinal: soft, non-tender; bowel sounds normal; no masses,  no organomegaly.   Musculoskeletal: Steady movement  Skin: Cool and  moist.  Psychiatric: Normal affect, non-agitated, not confused       LABS:  CMP Latest Ref Rng & Units 02/24/2021 10/07/2018 04/25/2014  Glucose 70 - 99 mg/dL 976(B) 85 96  BUN 6 - 20 mg/dL 13 9 14   Creatinine 0.44 - 1.00 mg/dL 3.41 0.8  Sodium 9.37 - 145 mmol/L 137 142 138  Potassium 3.5 - 5.1 mmol/L 3.5 3.7 3.6  Chloride 98 - 111 mmol/L 97(L) 105 108  CO2 22 - 32 mmol/L 27 23 25   Calcium 8.9 - 10.3 mg/dL 9.2 9.0 8.6  Total Protein 6.5 - 8.1 g/dL 8.0 6.8 6.6  Total Bilirubin 0.3 - 1.2 mg/dL 2.0(H) 0.4 0.5  Alkaline Phos 38 - 126 U/L 256(H) - 78  AST 15 - 41 U/L 616(H) 13 17  ALT 0 - 44 U/L 416(H) 10 12   CBC Latest Ref Rng & Units 02/24/2021 10/21/2018 10/07/2018  WBC 4.0 - 10.5 K/uL 11.3(H) 7.4 8.5  Hemoglobin 12.0 - 15.0 g/dL 10/23/2018 11.5(L) 11.5(L)  Hematocrit 36.0 - 46.0 % 39.0 35.3 35.3  Platelets 150 - 400 K/uL 412(H) 340 349     RADS: CLINICAL DATA:  Abdominal pain, elevated LFTs, evaluate for biliary obstruction  EXAM: MRI ABDOMEN WITHOUT AND WITH CONTRAST (INCLUDING MRCP)  TECHNIQUE: Multiplanar multisequence MR imaging of the abdomen was performed both before and after the administration of intravenous contrast. Heavily T2-weighted images of the biliary and pancreatic ducts were obtained, and three-dimensional MRCP images were rendered by post processing.  CONTRAST:  7.42mL GADAVIST GADOBUTROL 1 MMOL/ML IV SOLN  COMPARISON:  Right upper quadrant ultrasound dated 02/25/2021  FINDINGS: Lower chest: Lung bases are clear.  Hepatobiliary: No morphologic findings of cirrhosis. No hepatic steatosis. 15 mm cyst in segment 4A (series 4/image 11), benign.  Distended gallbladder with very mild irregular gallbladder wall thickening, layering gallbladder sludge, and mild pericholecystic inflammatory changes (series 4/image 23).  Dilated common duct, measuring 10 mm. Mild gallbladder sludge in the distal CBD (series 4/image 23). Although poorly visualized, there  is a suspected 3 mm distal CBD stone at the ampulla (series 19/image 7).  Pancreas:  Within normal limits.  Spleen:  Within normal limits.  Adrenals/Urinary Tract:  Adrenal glands are within normal limits.  Kidneys are within normal limits.  No hydronephrosis.  Stomach/Bowel: Stomach is notable for a tiny hiatal hernia.  Visualized bowel is grossly unremarkable.  Vascular/Lymphatic:  No evidence of abdominal aortic aneurysm.  No suspicious abdominal lymphadenopathy.  Other:  No abdominal ascites.  Musculoskeletal: No focal osseous lesions.  IMPRESSION: Distended gallbladder with layering gallbladder sludge and mild pericholecystic inflammatory changes. This raises concern for very early acute cholecystitis.  Dilated common duct, measuring 10 mm, with mild sludge and suspected 3 mm distal CBD stone at the ampulla. If surgical intervention is not performed, consider ERCP.   Electronically Signed   By: 4m M.D.   On: 02/25/2021 08:36 Assessment:      Acute cholecystitis choledocolithiasis  Recommend lap chole during this admission to prevent recurrence.  Plan:      Discussed the risk of surgery including post-op infxn, seroma, biloma, chronic pain, poor-delayed wound healing, retained gallstone, conversion to open procedure, post-op SBO or ileus, and need for additional procedures to address said risks.  The risks of general anesthetic including MI, CVA, sudden death or even reaction to anesthetic medications  also discussed. Alternatives include continued observation.  Benefits include possible symptom relief, prevention of complications including acute cholecystitis, pancreatitis.  Typical post operative recovery of 3-5 days rest, continued pain in area and incision sites, possible loose stools up to 4-6 weeks, also discussed.  The patient understands the risks, any and all questions were answered to the patient's satisfaction.  Surgery  scheduled for late pm on 02/27/21

## 2021-02-25 NOTE — Discharge Instructions (Addendum)
Laparoscopic Cholecystectomy, Care After This sheet gives you information about how to care for yourself after your procedure. Your doctor may also give you more specific instructions. If you have problems or questions, contact your doctor. Follow these instructions at home: Care for cuts from surgery (incisions)   Follow instructions from your doctor about how to take care of your cuts from surgery. Make sure you: ? Wash your hands with soap and water before you change your bandage (dressing). If you cannot use soap and water, use hand sanitizer. ? Change your bandage as told by your doctor. ? Leave stitches (sutures), skin glue, or skin tape (adhesive) strips in place. They may need to stay in place for 2 weeks or longer. If tape strips get loose and curl up, you may trim the loose edges. Do not remove tape strips completely unless your doctor says it is okay.  Do not take baths, swim, or use a hot tub until your doctor says it is okay. OK TO SHOWER 24HRS AFTER YOUR SURGERY.   Check your surgical cut area every day for signs of infection. Check for: ? More redness, swelling, or pain. ? More fluid or blood. ? Warmth. ? Pus or a bad smell. Activity  Do not drive or use heavy machinery while taking prescription pain medicine.  Do not play contact sports until your doctor says it is okay.  Do not drive for 24 hours if you were given a medicine to help you relax (sedative).  Rest as needed. Do not return to work or school until your doctor says it is okay. General instructions .  tylenol and advil as needed for discomfort.  Please alternate between the two every four hours as needed for pain.   .  Use narcotics, if prescribed, only when tylenol and motrin is not enough to control pain. .  325-650mg every 8hrs to max of 3000mg/24hrs (including the 325mg in every norco dose) for the tylenol.   .  Advil up to 800mg per dose every 8hrs as needed for pain.    To prevent or treat constipation  while you are taking prescription pain medicine, your doctor may recommend that you: ? Drink enough fluid to keep your pee (urine) clear or pale yellow. ? Take over-the-counter or prescription medicines. ? Eat foods that are high in fiber, such as fresh fruits and vegetables, whole grains, and beans. ? Limit foods that are high in fat and processed sugars, such as fried and sweet foods. Contact a doctor if:  You develop a rash.  You have more redness, swelling, or pain around your surgical cuts.  You have more fluid or blood coming from your surgical cuts.  Your surgical cuts feel warm to the touch.  You have pus or a bad smell coming from your surgical cuts.  You have a fever.  One or more of your surgical cuts breaks open. Get help right away if:  You have trouble breathing.  You have chest pain.  You have pain that is getting worse in your shoulders.  You faint or feel dizzy when you stand.  You have very bad pain in your belly (abdomen).  You are sick to your stomach (nauseous) for more than one day.  You have throwing up (vomiting) that lasts for more than one day.  You have leg pain. This information is not intended to replace advice given to you by your health care provider. Make sure you discuss any questions you have with your   health care provider. Document Released: 06/16/2008 Document Revised: 03/28/2016 Document Reviewed: 02/24/2016 Elsevier Interactive Patient Education  2019 [employer].   

## 2021-02-25 NOTE — ED Provider Notes (Signed)
[facility] Emergency Department Provider Note  ____________________________________________  Time seen: Approximately 2:08 AM  I have reviewed the triage vital signs and the nursing notes.   HISTORY  Chief Complaint Abdominal Pain   HPI 8 Jamie Gordon is a 45 y.o. female for evaluation of abdominal pain, vomiting and diarrhea.  Patient reports that her symptoms started last week but she was feeling better over the weekend.  Today she started feeling worse again.  She is complaining of severe generalized sharp abdominal pain associated with several daily episodes of nonbloody nonbilious emesis and watery diarrhea.  No fever, chills, body aches, cough, congestion, chest pain, shortness of breath, dysuria or hematuria.  Household members with no similar symptoms.  Patient denies any travel abroad.  Denies recently eating any of the recalled foods.   Patient also denies excessive use of Tylenol.  She reports that she took 2 Tylenol yesterday and 2 today.  Past Medical History:  Diagnosis Date  . Ectopic pregnancy    s/p left partial salpingectomy  . Endometriosis   . Female fertility problem   . Habitual abortion history, antepartum   . Miscarriage    x2 at 5 weeks    Patient Active Problem List   Diagnosis Date Noted  . Routine general medical examination at a health care facility 04/25/2014  . Obesity (BMI 30-39.9) 04/25/2014  . Adjustment disorder with mixed anxiety and depressed mood 07/04/2013  . 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 Infertility management 01/27/2013    Past Surgical History:  Procedure Laterality Date  . ECTOPIC PREGNANCY SURGERY  02/2013   Dr. Enzo Bi  . WISDOM TOOTH EXTRACTION      Prior to Admission medications   Medication Sig Start Date End Date Taking? Authorizing Provider  FLUoxetine (PROZAC) 40 MG capsule TAKE 1 CAPSULE BY MOUTH DAILY. 11/08/20 11/08/21  Burnard Hawthorne, FNP  Prenatal  MV-Min-Fe Fum-FA-DHA (PRENATAL 1 PO) Take by mouth.    [provider]    Allergies Latex  Family History  Problem Relation Age of Onset  . Hyperlipidemia Mother   . Cancer Maternal Grandmother        ovarian  . Heart disease Maternal Grandmother   . Arthritis Father   . Hypertension Father   . Ovarian cancer Maternal Grandfather   . Diabetes Neg Hx   . Colon cancer Neg Hx   . Breast cancer Neg Hx     Social History Social History   Tobacco Use  . Smoking status: Never Smoker  . Smokeless tobacco: Never Used  Substance Use Topics  . Alcohol use: No  . Drug use: No    Review of Systems  Constitutional: Negative for fever. Eyes: Negative for visual changes. ENT: Negative for sore throat. Neck: No neck pain  Cardiovascular: Negative for chest pain. Respiratory: Negative for shortness of breath. Gastrointestinal: + abdominal pain, vomiting and diarrhea. Genitourinary: Negative for dysuria. Musculoskeletal: Negative for back pain. Skin: Negative for rash. Neurological: Negative for headaches, weakness or numbness. Psych: No SI or HI  ____________________________________________   PHYSICAL EXAM:  VITAL SIGNS: ED Triage Vitals  Enc Vitals Group     BP 02/24/21 2224 (!) 121/92     Pulse Rate 02/24/21 2224 88     Resp 02/24/21 2224 20     Temp 02/24/21 2224 98.2 F (36.8 C)     Temp Source 02/24/21 2224 Oral     SpO2 02/24/21 2224 99 %     Weight  02/24/21 2225 196 lb (88.9 kg)     Height 02/24/21 2225 $RemoveBefor'5\' 4"'[id number]$  (1.626 m)     Head Circumference --      Peak Flow --      Pain Score 02/24/21 2225 10     Pain Loc --      Pain Edu? --      Excl. in [place]? --     Constitutional: Alert and oriented. Well appearing and in no apparent distress. HEENT:      Head: Normocephalic and atraumatic.         Eyes: Conjunctivae are normal. Sclera is non-icteric.       Mouth/Throat: Mucous membranes are moist.       Neck: Supple with no signs of  meningismus. Cardiovascular: Regular rate and rhythm. No murmurs, gallops, or rubs. 2+ symmetrical distal pulses are present in all extremities. No JVD. Respiratory: Normal respiratory effort. Lungs are clear to auscultation bilaterally.  Gastrointestinal: Soft and nondistended, tender to palpation in the epigastric and right upper quadrant with positive Murphy sign.  No rebound or guarding  genitourinary: No CVA tenderness. Musculoskeletal:  No edema, cyanosis, or erythema of extremities. Neurologic: Normal speech and language. Face is symmetric. Moving all extremities. No gross focal neurologic deficits are appreciated. Skin: Skin is warm, dry and intact. No rash noted. Psychiatric: Mood and affect are normal. Speech and behavior are normal.  ____________________________________________   LABS (all labs ordered are listed, but only abnormal results are displayed)  Labs Reviewed  CBC - Abnormal; Notable for the following components:      Result Value   WBC 11.3 (*)    Platelets 412 (*)    All other components within normal limits  COMPREHENSIVE METABOLIC PANEL - Abnormal; Notable for the following components:   Chloride 97 (*)    Glucose, Bld 162 (*)    AST 616 (*)    ALT 416 (*)    Alkaline Phosphatase 256 (*)    Total Bilirubin 2.0 (*)    All other components within normal limits  LIPASE, BLOOD - Abnormal; Notable for the following components:   Lipase 53 (*)    All other components within normal limits  URINALYSIS, COMPLETE (UACMP) WITH MICROSCOPIC - Abnormal; Notable for the following components:   Color, Urine YELLOW (*)    APPearance HAZY (*)    Glucose, UA 50 (*)    Ketones, ur 20 (*)    Protein, ur 100 (*)    Bacteria, UA RARE (*)    All other components within normal limits  HEPATITIS PANEL, ACUTE  POC URINE PREG, ED   ____________________________________________  EKG  none  ____________________________________________  RADIOLOGY  I have personally  reviewed the images performed during this visit and I agree with the Radiologist's read.   Interpretation by Radiologist:  US ABDOMEN LIMITED RUQ (LIVER/GB)  Result Date: 02/25/2021 CLINICAL DATA:  Transaminitis. EXAM: ULTRASOUND ABDOMEN LIMITED RIGHT UPPER QUADRANT COMPARISON:  None. FINDINGS: Gallbladder: Sludge within the gallbladder lumen. Slightly hydropic gallbladder. No gallstones or wall thickening visualized. No sonographic Murphy sign noted by sonographer. Common bile duct: Diameter: 8 mm. Liver: Thinly septated 1.4 x 1.5 x 1.6 cm cystic lesion within the right hepatic lobe. Otherwise no focal lesion identified. Within normal limits in parenchymal echogenicity. Portal vein is patent on color Doppler imaging with normal direction of blood flow towards the liver. Other: None. IMPRESSION: 1. Gallbladder sludge with a mildly distended common bile duct. Sludge noted within the lumen. 2. No acute  cholecystitis. Electronically Signed   By: Iven Finn M.D.   On: 02/25/2021 04:19      ____________________________________________   PROCEDURES  Procedure(s) performed: None Procedures Critical Care performed:  None ____________________________________________   INITIAL IMPRESSION / ASSESSMENT AND PLAN / ED COURSE  45 y.o. female for evaluation of abdominal pain, vomiting and diarrhea.  Patient is well-appearing in no distress with normal vital signs, abdomen is soft and nondistended with right upper quadrant epigastric tenderness and positive Murphy sign.  Differential diagnosis including gallbladder pathology versus pancreatitis versus gastroenteritis versus colitis versus peptic ulcer disease versus food poisoning versus hepatitis.  Labs showing mild leukocytosis with white count of 11.3.  AST of 616, ALT of 416, alk phos of 256 and T bili of 2, lipase of 53.  We will check a right upper quadrant ultrasound.  We will check an acute hepatitis panel since there has been several cases  recently of hepatitis a associated with organic strawberries.  We will treat with IV fluids, Toradol and Zofran.   _________________________ 4:36 AM on 02/25/2021 -----------------------------------------  Ultrasound showing sludge and a enlarged common bile duct but no signs of cholecystitis.  Discussed with Dr. Lysle Pearl and due to the abnormal CBD diameter and the abnormal blood work he recommended an MRCP to rule out choledocholithiasis.  Patient was reassessed and has no pain at this time.  She has no tenderness on palpation.  MRI is ordered.  _________________________ 6:37 AM on 02/25/2021 -----------------------------------------  MRCP pending. Care transferred to incoming MD at [facility] to admit if MRCP is positive for choledocholithiasis or if pain returns.     _____________________________________________ Please note:  Patient was evaluated in Emergency Department today for the symptoms described in the history of present illness. Patient was evaluated in the context of the global COVID-19 pandemic, which necessitated consideration that the patient might be at risk for infection with the SARS-CoV-2 virus that causes COVID-19. Institutional protocols and algorithms that pertain to the evaluation of patients at risk for COVID-19 are in a state of rapid change based on information released by regulatory bodies including the CDC and federal and state organizations. These policies and algorithms were followed during the patient's care in the ED.  Some ED evaluations and interventions may be delayed as a result of limited staffing during the pandemic.    Controlled Substance Database was reviewed by me. ____________________________________________   FINAL CLINICAL IMPRESSION(S) / ED DIAGNOSES   Final diagnoses:  Transaminitis  Pain of upper abdomen      NEW MEDICATIONS STARTED DURING THIS VISIT:  ED Discharge Orders    None       Note:  This document was prepared using  Dragon voice recognition software and may include unintentional dictation errors.    Alfred Levins, [place], MD 02/25/21 [phone]

## 2021-02-25 NOTE — Consult Note (Addendum)
Subjective:   CC: acute cholecystitis  HPI:  Jamie Gordon is a 45 y.o. female who is consulted by Don Perking for evaluation of above cc.  Symptoms were first noted a few days ago. Pain is sharp, RUQ, now resolved.  Associated with nothing specific, exacerbated by nothing specific.  Elevated LFTs alogn with dilated CBD noted, subsequent MRCP showed CBD stone.  She is now s/p ERCP.  No complaints currently      Past Medical History:  has a past medical history of Ectopic pregnancy, Endometriosis, Female fertility problem, Habitual abortion history, antepartum, and Miscarriage.  Past Surgical History:  has a past surgical history that includes Ectopic pregnancy surgery (02/2013) and Wisdom tooth extraction.  Family History: family history includes Arthritis in her father; Cancer in her maternal grandmother; Heart disease in her maternal grandmother; Hyperlipidemia in her mother; Hypertension in her father; Ovarian cancer in her maternal grandfather.  Social History:  reports that she has never smoked. She has never used smokeless tobacco. She reports that she does not drink alcohol and does not use drugs.  Current Medications:  Prior to Admission medications   Medication Sig Start Date End Date Taking? Authorizing Provider  calcium carbonate (OS-CAL - DOSED IN MG OF ELEMENTAL CALCIUM) 1250 (500 Ca) MG tablet Take 1 tablet by mouth daily.   Yes [provider]  cholecalciferol (VITAMIN D3) 25 MCG (1000 UNIT) tablet Take 1,000 Units by mouth daily.   Yes [provider]  esomeprazole (NEXIUM) 20 MG capsule Take 20 mg by mouth daily as needed (acid reflux symptoms).   Yes [provider]  FLUoxetine (PROZAC) 40 MG capsule TAKE 1 CAPSULE BY MOUTH DAILY. Patient taking differently: Take 40 mg by mouth daily. 11/08/20 11/08/21 Yes Arnett, Lyn Records, FNP  Multiple Vitamins-Minerals (MULTIVITAMIN WITH MINERALS) tablet Take 1 tablet by mouth daily.   Yes [provider]    Allergies:  Allergies as of 02/24/2021 - Review Complete 02/24/2021  Allergen Reaction Noted  . Latex Itching 06/24/2015    ROS:  General: Denies weight loss, weight gain, fatigue, fevers, chills, and night sweats. Eyes: Denies blurry vision, double vision, eye pain, itchy eyes, and tearing. Ears: Denies hearing loss, earache, and ringing in ears. Nose: Denies sinus pain, congestion, infections, runny nose, and nosebleeds. Mouth/throat: Denies hoarseness, sore throat, bleeding gums, and difficulty swallowing. Heart: Denies chest pain, palpitations, racing heart, irregular heartbeat, leg pain or swelling, and decreased activity tolerance. Respiratory: Denies breathing difficulty, shortness of breath, wheezing, cough, and sputum. GI: Denies change in appetite, heartburn, nausea, vomiting, constipation, diarrhea, and blood in stool. GU: Denies difficulty urinating, pain with urinating, urgency, frequency, blood in urine. Musculoskeletal: Denies joint stiffness, pain, swelling, muscle weakness. Skin: Denies rash, itching, mass, tumors, sores, and boils Neurologic: Denies headache, fainting, dizziness, seizures, numbness, and tingling. Psychiatric: Denies depression, anxiety, difficulty sleeping, and memory loss. Endocrine: Denies heat or cold intolerance, and increased thirst or urination. Blood/lymph: Denies easy bruising, and swollen glands     Objective:     BP 105/71   Pulse 87   Temp (!) 97.5 F (36.4 C) (Temporal)   Resp (!) 21   Ht 5\' 4"  (1.626 m)   Wt 88.9 kg   LMP 02/12/2021   SpO2 99%   BMI 33.64 kg/m    Constitutional :  alert, cooperative, appears stated age and no distress  Lymphatics/Throat:  no asymmetry, masses, or scars  Respiratory:  clear to auscultation bilaterally  Cardiovascular:  regular rate and  rhythm  Gastrointestinal: soft, non-tender; bowel sounds normal; no masses,  no organomegaly.   Musculoskeletal: Steady movement  Skin: Cool and  moist.  Psychiatric: Normal affect, non-agitated, not confused       LABS:  CMP Latest Ref Rng & Units 02/24/2021 10/07/2018 04/25/2014  Glucose 70 - 99 mg/dL 976(B) 85 96  BUN 6 - 20 mg/dL 13 9 14   Creatinine 0.44 - 1.00 mg/dL 3.41 0.8  Sodium 9.37 - 145 mmol/L 137 142 138  Potassium 3.5 - 5.1 mmol/L 3.5 3.7 3.6  Chloride 98 - 111 mmol/L 97(L) 105 108  CO2 22 - 32 mmol/L 27 23 25   Calcium 8.9 - 10.3 mg/dL 9.2 9.0 8.6  Total Protein 6.5 - 8.1 g/dL 8.0 6.8 6.6  Total Bilirubin 0.3 - 1.2 mg/dL 2.0(H) 0.4 0.5  Alkaline Phos 38 - 126 U/L 256(H) - 78  AST 15 - 41 U/L 616(H) 13 17  ALT 0 - 44 U/L 416(H) 10 12   CBC Latest Ref Rng & Units 02/24/2021 10/21/2018 10/07/2018  WBC 4.0 - 10.5 K/uL 11.3(H) 7.4 8.5  Hemoglobin 12.0 - 15.0 g/dL 10/23/2018 11.5(L) 11.5(L)  Hematocrit 36.0 - 46.0 % 39.0 35.3 35.3  Platelets 150 - 400 K/uL 412(H) 340 349     RADS: CLINICAL DATA:  Abdominal pain, elevated LFTs, evaluate for biliary obstruction  EXAM: MRI ABDOMEN WITHOUT AND WITH CONTRAST (INCLUDING MRCP)  TECHNIQUE: Multiplanar multisequence MR imaging of the abdomen was performed both before and after the administration of intravenous contrast. Heavily T2-weighted images of the biliary and pancreatic ducts were obtained, and three-dimensional MRCP images were rendered by post processing.  CONTRAST:  7.42mL GADAVIST GADOBUTROL 1 MMOL/ML IV SOLN  COMPARISON:  Right upper quadrant ultrasound dated 02/25/2021  FINDINGS: Lower chest: Lung bases are clear.  Hepatobiliary: No morphologic findings of cirrhosis. No hepatic steatosis. 15 mm cyst in segment 4A (series 4/image 11), benign.  Distended gallbladder with very mild irregular gallbladder wall thickening, layering gallbladder sludge, and mild pericholecystic inflammatory changes (series 4/image 23).  Dilated common duct, measuring 10 mm. Mild gallbladder sludge in the distal CBD (series 4/image 23). Although poorly visualized, there  is a suspected 3 mm distal CBD stone at the ampulla (series 19/image 7).  Pancreas:  Within normal limits.  Spleen:  Within normal limits.  Adrenals/Urinary Tract:  Adrenal glands are within normal limits.  Kidneys are within normal limits.  No hydronephrosis.  Stomach/Bowel: Stomach is notable for a tiny hiatal hernia.  Visualized bowel is grossly unremarkable.  Vascular/Lymphatic:  No evidence of abdominal aortic aneurysm.  No suspicious abdominal lymphadenopathy.  Other:  No abdominal ascites.  Musculoskeletal: No focal osseous lesions.  IMPRESSION: Distended gallbladder with layering gallbladder sludge and mild pericholecystic inflammatory changes. This raises concern for very early acute cholecystitis.  Dilated common duct, measuring 10 mm, with mild sludge and suspected 3 mm distal CBD stone at the ampulla. If surgical intervention is not performed, consider ERCP.   Electronically Signed   By: 4m M.D.   On: 02/25/2021 08:36 Assessment:      Acute cholecystitis choledocolithiasis  Recommend lap chole during this admission to prevent recurrence.  Plan:      Discussed the risk of surgery including post-op infxn, seroma, biloma, chronic pain, poor-delayed wound healing, retained gallstone, conversion to open procedure, post-op SBO or ileus, and need for additional procedures to address said risks.  The risks of general anesthetic including MI, CVA, sudden death or even reaction to anesthetic medications  also discussed. Alternatives include continued observation.  Benefits include possible symptom relief, prevention of complications including acute cholecystitis, pancreatitis.  Typical post operative recovery of 3-5 days rest, continued pain in area and incision sites, possible loose stools up to 4-6 weeks, also discussed.  The patient understands the risks, any and all questions were answered to the patient's satisfaction.  Surgery  scheduled for late pm on 02/27/21

## 2021-02-25 NOTE — H&P (Signed)
Midge Minium, MD [facility] [address] [place], [place] [address] Phone:[phone] Fax : [phone]  Primary Care Physician:  Allegra Grana, FNP Primary Gastroenterologist:  Dr. Servando Snare  Pre-Procedure History & Physical: HPI:  Jamie Gordon is a 45 y.o. female is here for an ERCP.   Past Medical History:  Diagnosis Date  . Ectopic pregnancy    s/p left partial salpingectomy  . Endometriosis   . Female fertility problem   . Habitual abortion history, antepartum   . Miscarriage    x2 at 5 weeks    Past Surgical History:  Procedure Laterality Date  . ECTOPIC PREGNANCY SURGERY  02/2013   Dr. Greggory Keen  . WISDOM TOOTH EXTRACTION      Prior to Admission medications   Medication Sig Start Date End Date Taking? Authorizing Provider  calcium carbonate (OS-CAL - DOSED IN MG OF ELEMENTAL CALCIUM) 1250 (500 Ca) MG tablet Take 1 tablet by mouth daily.   Yes [provider]  cholecalciferol (VITAMIN D3) 25 MCG (1000 UNIT) tablet Take 1,000 Units by mouth daily.   Yes [provider]  esomeprazole (NEXIUM) 20 MG capsule Take 20 mg by mouth daily as needed (acid reflux symptoms).   Yes [provider]  FLUoxetine (PROZAC) 40 MG capsule TAKE 1 CAPSULE BY MOUTH DAILY. Patient taking differently: Take 40 mg by mouth daily. 11/08/20 11/08/21 Yes Arnett, Lyn Records, FNP  Multiple Vitamins-Minerals (MULTIVITAMIN WITH MINERALS) tablet Take 1 tablet by mouth daily.   Yes [provider]    Allergies as of 02/24/2021 - Review Complete 02/24/2021  Allergen Reaction Noted  . Latex Itching 06/24/2015    Family History  Problem Relation Age of Onset  . Hyperlipidemia Mother   . Cancer Maternal Grandmother        ovarian  . Heart disease Maternal Grandmother   . Arthritis Father   . Hypertension Father   . Ovarian cancer Maternal Grandfather   . Diabetes Neg Hx   . Colon cancer Neg Hx   . Breast cancer Neg Hx     Social History    Socioeconomic History  . Marital status: Married    Spouse name: Not on file  . Number of children: Not on file  . Years of education: Not on file  . Highest education level: Not on file  Occupational History  . Not on file  Tobacco Use  . Smoking status: Never Smoker  . Smokeless tobacco: Never Used  Vaping Use  . Vaping Use: Never used  Substance and Sexual Activity  . Alcohol use: No  . Drug use: No  . Sexual activity: Yes    Birth control/protection: None  Other Topics Concern  . Not on file  Social History Narrative   Lives in [place] with husband, married in 2011. No pets.      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      Diet - regular, no caffiene   Social Determinants of Health   Financial Resource Strain: Not on file  Food Insecurity: Not on file  Transportation Needs: Not on file  Physical Activity: Not on file  Stress: Not on file  Social Connections: Not on file  Intimate Partner Violence: Not on file    Review of Systems: See HPI, otherwise negative ROS  Physical Exam: BP 111/80   Pulse (!) 103   Temp 98.5 F (36.9 C) (Temporal)   Resp 16   Ht 5\' 4"  (1.626 m)  Wt 88.9 kg   LMP 02/12/2021   SpO2 97%   BMI 33.64 kg/m  General:   Alert,  [name] cooperative in NAD Head:  Normocephalic and atraumatic. Neck:  Supple; no masses or thyromegaly. Lungs:  Clear throughout to auscultation.    Heart:  Regular rate and rhythm. Abdomen:  Soft, nontender and nondistended. Normal bowel sounds, without guarding, and without rebound.   Neurologic:  Alert and  oriented x4;  grossly normal neurologically.  Impression/Plan: Jamie Gordon is here for an ERCP to be performed for CBD stone  Risks, benefits, limitations, and alternatives regarding  ERCP have been reviewed with the patient.  Questions have been answered.  All parties agreeable.   Midge Minium, MD  02/25/2021, 11:35 AM

## 2021-02-25 NOTE — ED Notes (Signed)
This RN to bedside, introduced self to patient, this RN notified by MRI tech that patient removed IV, this RN re-initiated IV access and patient taken to MRI. Pt visualized in NAD at this time.

## 2021-02-25 NOTE — Consult Note (Signed)
Pharmacy Antibiotic Note  Jamie Gordon is a 45 y.o. female admitted on 02/25/2021 with Choledocholithiasis.  Pharmacy has been consulted for Pip/tazo dosing.  Plan: Zosyn 3.375g IV q8h (4 hour infusion).  Height: 5\' 4"  (162.6 cm) Weight: 88.9 kg (196 lb) IBW/kg (Calculated) : 54.7  Temp (24hrs), Avg:98.2 F (36.8 C), Min:98.2 F (36.8 C), Max:98.2 F (36.8 C)  Recent Labs  Lab 02/24/21 2226  WBC 11.3*  CREATININE 0.79    Estimated Creatinine Clearance: 97.9 mL/min (by C-G formula based on SCr of 0.79 mg/dL).    Allergies  Allergen Reactions  . Latex Itching    Antimicrobials this admission: 6/7 pip/tazo >>   Dose adjustments this admission: None  Microbiology results: 6/7 BCx: pending  Thank you for allowing pharmacy to be a part of this patient's care.  8/7, PharmD, BCPS 02/25/2021 9:49 AM

## 2021-02-25 NOTE — Transfer of Care (Signed)
Immediate Anesthesia Transfer of Care Note  Patient: Jamie Gordon  Procedure(s) Performed: ENDOSCOPIC RETROGRADE CHOLANGIOPANCREATOGRAPHY (ERCP) (N/A )  Patient Location: PACU  Anesthesia Type:General  Level of Consciousness: awake and sedated  Airway & Oxygen Therapy: Patient Spontanous Breathing and Patient connected to nasal cannula oxygen  Post-op Assessment: Report given to RN and Post -op Vital signs reviewed and stable  Post vital signs: Reviewed and stable  Last Vitals:  Vitals Value Taken Time  BP    Temp    Pulse    Resp    SpO2      Last Pain:  Vitals:   02/25/21 1103  TempSrc: Temporal  PainSc: 0-No pain         Complications: No complications documented.

## 2021-02-25 NOTE — Anesthesia Procedure Notes (Signed)
Date/Time: 02/25/2021 11:49 AM Performed by: Junious Silk, CRNA Pre-anesthesia Checklist: Patient identified, Emergency Drugs available, Suction available, Patient being monitored and Timeout performed Oxygen Delivery Method: Nasal cannula

## 2021-02-25 NOTE — ED Notes (Signed)
Pt visualized in NAD, resting in bed with lights dimmed with family member at bedside, call bell within reach of patient at this time. Pt requesting something to drink, this RN explained not until MD reviewed results. Pt states understanding, denies further needs.

## 2021-02-26 ENCOUNTER — Encounter: Payer: Self-pay | Admitting: Gastroenterology

## 2021-02-26 ENCOUNTER — Ambulatory Visit: Payer: 59 | Admitting: Family

## 2021-02-26 ENCOUNTER — Telehealth: Payer: Self-pay

## 2021-02-26 LAB — CBC
HCT: 32.9 % — ABNORMAL LOW (ref 36.0–46.0)
Hemoglobin: 11.2 g/dL — ABNORMAL LOW (ref 12.0–15.0)
MCH: 28.3 pg (ref 26.0–34.0)
MCHC: 34 g/dL (ref 30.0–36.0)
MCV: 83.1 fL (ref 80.0–100.0)
Platelets: 259 10*3/uL (ref 150–400)
RBC: 3.96 MIL/uL (ref 3.87–5.11)
RDW: 12.8 % (ref 11.5–15.5)
WBC: 9.3 10*3/uL (ref 4.0–10.5)
nRBC: 0 % (ref 0.0–0.2)

## 2021-02-26 LAB — COMPREHENSIVE METABOLIC PANEL
ALT: 241 U/L — ABNORMAL HIGH (ref 0–44)
AST: 172 U/L — ABNORMAL HIGH (ref 15–41)
Albumin: 2.4 g/dL — ABNORMAL LOW (ref 3.5–5.0)
Alkaline Phosphatase: 170 U/L — ABNORMAL HIGH (ref 38–126)
Anion gap: 12 (ref 5–15)
BUN: 11 mg/dL (ref 6–20)
CO2: 28 mmol/L (ref 22–32)
Calcium: 8.1 mg/dL — ABNORMAL LOW (ref 8.9–10.3)
Chloride: 100 mmol/L (ref 98–111)
Creatinine, Ser: 0.56 mg/dL (ref 0.44–1.00)
GFR, Estimated: >60 mL/min (ref >60)
Glucose, Bld: 86 mg/dL (ref 70–99)
Potassium: 3 mmol/L — ABNORMAL LOW (ref 3.5–5.1)
Sodium: 140 mmol/L (ref 135–145)
Total Bilirubin: 3.8 mg/dL — ABNORMAL HIGH (ref 0.3–1.2)
Total Protein: 5.5 g/dL — ABNORMAL LOW (ref 6.5–8.1)

## 2021-02-26 LAB — GLUCOSE, CAPILLARY: Glucose-Capillary: 85 mg/dL (ref 70–99)

## 2021-02-26 NOTE — Anesthesia Postprocedure Evaluation (Signed)
Anesthesia Post Note  Patient: Jarrett M Foti  Procedure(s) Performed: ENDOSCOPIC RETROGRADE CHOLANGIOPANCREATOGRAPHY (ERCP) (N/A )  Patient location during evaluation: Endoscopy Anesthesia Type: General Level of consciousness: awake and alert Pain management: pain level controlled Vital Signs Assessment: post-procedure vital signs reviewed and stable Respiratory status: spontaneous breathing, nonlabored ventilation, respiratory function stable and patient connected to nasal cannula oxygen Cardiovascular status: blood pressure returned to baseline and stable Postop Assessment: no apparent nausea or vomiting Anesthetic complications: no   No complications documented.   Last Vitals:  Vitals:   02/25/21 1937 02/26/21 0410  BP: 108/66 106/63  Pulse: 81 80  Resp: 20 16  Temp: 37.3 C 37.1 C  SpO2: 96% 100%    Last Pain:  Vitals:   02/25/21 2100  TempSrc:   PainSc: 0-No pain                 Corinda Gubler

## 2021-02-26 NOTE — Telephone Encounter (Signed)
Patient called in to access nurse at 8:57pm to cancel an appointment.

## 2021-02-26 NOTE — Progress Notes (Signed)
Jamie Antigua, MD [address], [place], [place], [place], [address] [address], [place], [place], [place], [address] Phone: [phone]  Fax: [phone]   Subjective: Patient resting in bed comfortably.  Denies any abdominal pain, no nausea or vomiting.  Has not had any further abdominal pain since her ERCP yesterday.  No fever or chills.  Tolerating oral diet   Objective: Exam: Vital signs in last 24 hours: Vitals:   02/25/21 1446 02/25/21 1937 02/26/21 0410 02/26/21 1008  BP: 112/71 108/66 106/63 122/79  Pulse: 73 81 80 91  Resp: $Remo'18 20 16   'lwmfn$ Temp: 99.1 F (37.3 C) 99.1 F (37.3 C) 98.7 F (37.1 C) 98.7 F (37.1 C)  TempSrc: Oral Oral  Oral  SpO2: 98% 96% 100% 98%  Weight:      Height:       Weight change: -0.005 kg  Intake/Output Summary (Last 24 hours) at 02/26/2021 1012 Last data filed at 02/26/2021 0400 Gross per 24 hour  Intake 1183.19 ml  Output --  Net 1183.19 ml    General: No acute distress, AAO x3 Abd: Soft, NT/ND, No HSM Skin: Warm, no rashes Neck: Supple, Trachea midline   Lab Results: Lab Results  Component Value Date   WBC 9.3 02/26/2021   HGB 11.2 (L) 02/26/2021   HCT 32.9 (L) 02/26/2021   MCV 83.1 02/26/2021   PLT 259 02/26/2021   Micro Results: Recent Results (from the past 240 hour(s))  Resp Panel by RT-PCR (Flu A&B, Covid) Nasopharyngeal Swab     Status: None   Collection Time: 02/25/21  9:07 AM   Specimen: Nasopharyngeal Swab; Nasopharyngeal(NP) swabs in vial transport medium  Result Value Ref Range Status   SARS Coronavirus 2 by RT PCR NEGATIVE NEGATIVE Final    Comment: (NOTE) SARS-CoV-2 target nucleic acids are NOT DETECTED.  The SARS-CoV-2 RNA is generally detectable in upper respiratory specimens during the acute phase of infection. The lowest concentration of SARS-CoV-2 viral copies this assay can detect is 138 copies/mL. A negative result does not preclude SARS-Cov-2 infection and should not be used as the  sole basis for treatment or other patient management decisions. A negative result may occur with  improper specimen collection/handling, submission of specimen other than nasopharyngeal swab, presence of viral mutation(s) within the areas targeted by this assay, and inadequate number of viral copies(<138 copies/mL). A negative result must be combined with clinical observations, patient history, and epidemiological information. The expected result is Negative.  Fact Sheet for Patients:  EntrepreneurPulse.com.au  Fact Sheet for Healthcare Providers:  IncredibleEmployment.be  This test is no t yet approved or cleared by the [place] FDA and  has been authorized for detection and/or diagnosis of SARS-CoV-2 by FDA under an Emergency Use Authorization (EUA). This EUA will remain  in effect (meaning this test can be used) for the duration of the COVID-19 declaration under Section 564(b)(1) of the Act, 21 U.S.C.section 360bbb-3(b)(1), unless the authorization is terminated  or revoked sooner.       Influenza A by PCR NEGATIVE NEGATIVE Final   Influenza B by PCR NEGATIVE NEGATIVE Final    Comment: (NOTE) The Xpert Xpress SARS-CoV-2/FLU/RSV plus assay is intended as an aid in the diagnosis of influenza from Nasopharyngeal swab specimens and should not be used as a sole basis for treatment. Nasal washings and aspirates are unacceptable for Xpert Xpress SARS-CoV-2/FLU/RSV testing.  Fact Sheet for Patients: EntrepreneurPulse.com.au  Fact Sheet for Healthcare Providers: IncredibleEmployment.be  This test is not yet  approved or cleared by the [place] and has been authorized for detection and/or diagnosis of SARS-CoV-2 by FDA under an Emergency Use Authorization (EUA). This EUA will remain in effect (meaning this test can be used) for the duration of the COVID-19 declaration under Section 564(b)(1) of the  Act, 21 U.S.C. section 360bbb-3(b)(1), unless the authorization is terminated or revoked.  Performed at [facility], [address] [place], [place] [address]   Blood culture (routine x 2)     Status: None (Preliminary result)   Collection Time: 02/25/21  9:07 AM   Specimen: BLOOD  Result Value Ref Range Status   Specimen Description BLOOD RT FOREARM  Final   Special Requests   Final    BOTTLES DRAWN AEROBIC AND ANAEROBIC Blood Culture results may not be optimal due to an inadequate volume of blood received in culture bottles   Culture   Final    NO GROWTH < 24 HOURS Performed at [facility], [address], [place], [place] [address]    Report Status PENDING  Incomplete  Blood culture (routine x 2)     Status: None (Preliminary result)   Collection Time: 02/25/21  9:15 AM   Specimen: BLOOD  Result Value Ref Range Status   Specimen Description BLOOD RIGHT [facility]  Final   Special Requests   Final    BOTTLES DRAWN AEROBIC AND ANAEROBIC Blood Culture adequate volume   Culture   Final    NO GROWTH < 24 HOURS Performed at [facility], [address] [place], [place] [address]    Report Status PENDING  Incomplete   Studies/Results: DG C-Arm 1-60 Min-No Report  Result Date: 02/25/2021 Fluoroscopy was utilized by the requesting physician.  No radiographic interpretation.   MR ABDOMEN MRCP W WO CONTAST  Result Date: 02/25/2021 CLINICAL DATA:  Abdominal pain, elevated LFTs, evaluate for biliary obstruction EXAM: MRI ABDOMEN WITHOUT AND WITH CONTRAST (INCLUDING MRCP) TECHNIQUE: Multiplanar multisequence MR imaging of the abdomen was performed both before and after the administration of intravenous contrast. Heavily T2-weighted images of the biliary and pancreatic ducts were obtained, and three-dimensional MRCP images were rendered by post processing. CONTRAST:  7.86mL GADAVIST GADOBUTROL 1 MMOL/ML IV SOLN COMPARISON:  Right upper quadrant ultrasound dated  02/25/2021 FINDINGS: Lower chest: Lung bases are clear. Hepatobiliary: No morphologic findings of cirrhosis. No hepatic steatosis. 15 mm cyst in segment 4A (series 4/image 11), benign. Distended gallbladder with very mild irregular gallbladder wall thickening, layering gallbladder sludge, and mild pericholecystic inflammatory changes (series 4/image 23). Dilated common duct, measuring 10 mm. Mild gallbladder sludge in the distal CBD (series 4/image 23). Although poorly visualized, there is a suspected 3 mm distal CBD stone at the ampulla (series 19/image 7). Pancreas:  Within normal limits. Spleen:  Within normal limits. Adrenals/Urinary Tract:  Adrenal glands are within normal limits. Kidneys are within normal limits.  No hydronephrosis. Stomach/Bowel: Stomach is notable for a tiny hiatal hernia. Visualized bowel is grossly unremarkable. Vascular/Lymphatic:  No evidence of abdominal aortic aneurysm. No suspicious abdominal lymphadenopathy. Other:  No abdominal ascites. Musculoskeletal: No focal osseous lesions. IMPRESSION: Distended gallbladder with layering gallbladder sludge and mild pericholecystic inflammatory changes. This raises concern for very early acute cholecystitis. Dilated common duct, measuring 10 mm, with mild sludge and suspected 3 mm distal CBD stone at the ampulla. If surgical intervention is not performed, consider ERCP. Electronically Signed   By: Julian Hy M.D.   On: 02/25/2021 08:36   US ABDOMEN LIMITED  RUQ (LIVER/GB)  Result Date: 02/25/2021 CLINICAL DATA:  Transaminitis. EXAM: ULTRASOUND ABDOMEN LIMITED RIGHT UPPER QUADRANT COMPARISON:  None. FINDINGS: Gallbladder: Sludge within the gallbladder lumen. Slightly hydropic gallbladder. No gallstones or wall thickening visualized. No sonographic Murphy sign noted by sonographer. Common bile duct: Diameter: 8 mm. Liver: Thinly septated 1.4 x 1.5 x 1.6 cm cystic lesion within the right hepatic lobe. Otherwise no focal lesion identified.  Within normal limits in parenchymal echogenicity. Portal vein is patent on color Doppler imaging with normal direction of blood flow towards the liver. Other: None. IMPRESSION: 1. Gallbladder sludge with a mildly distended common bile duct. Sludge noted within the lumen. 2. No acute cholecystitis. Electronically Signed   By: Iven Finn M.D.   On: 02/25/2021 04:19   Medications:  Scheduled Meds: . calcium carbonate  1 tablet Oral Daily  . cholecalciferol  1,000 Units Oral Daily  . FLUoxetine  40 mg Oral Daily  . multivitamin with minerals  1 tablet Oral Daily  . pantoprazole  20 mg Oral Daily   Continuous Infusions: . lactated ringers 75 mL/hr at 02/26/21 0522  . piperacillin-tazobactam (ZOSYN)  IV 3.375 g (02/26/21 0523)   PRN Meds:.ibuprofen, morphine injection, ondansetron (ZOFRAN) IV   Assessment: Principal Problem:   Choledocholithiasis Active Problems:   Adjustment disorder with mixed anxiety and depressed mood   Transaminitis   Acute cholecystitis   GERD (gastroesophageal reflux disease)    Plan: Patient abdominal pain has completely resolved and she does not have any evidence of pancreatitis  Her liver enzymes, specifically bilirubin remains elevated compared to yesterday  However, all the other liver enzymes have improved  Given that no further abdominal pain is present, elevation in liver enzymes, when alk phos has decreased, is unlikely to be from persistent biliary obstruction  Would recommend repeating CMP again tomorrow to reevaluate  However, if patient develops new symptoms such as recurrent abdominal pain, we will need to reassess sooner, please page GI on-call if this occurs  Proceed with cholecystectomy as planned by surgery team   LOS: 1 day   Jamie Antigua, MD 02/26/2021, 10:12 AM

## 2021-02-26 NOTE — Progress Notes (Signed)
1        [place] at [facility]   PATIENT NAME: Jamie Gordon    MR#:  [id number]  DATE OF BIRTH:  April 17, 1976  SUBJECTIVE:  CHIEF COMPLAINT:   Chief Complaint  Patient presents with  . Abdominal Pain  Denies any new complaints.  No nausea/vomiting or significant abdominal pain since ERCP yesterday.  Wanting to know when she is undergoing surgery for her gallbladder REVIEW OF SYSTEMS:  Review of Systems  Constitutional: Negative for diaphoresis, fever, malaise/fatigue and weight loss.  HENT: Negative for ear discharge, ear pain, hearing loss, nosebleeds, sore throat and tinnitus.   Eyes: Negative for blurred vision and pain.  Respiratory: Negative for cough, hemoptysis, shortness of breath and wheezing.   Cardiovascular: Negative for chest pain, palpitations, orthopnea and leg swelling.  Gastrointestinal: Negative for abdominal pain, blood in stool, constipation, diarrhea, heartburn, nausea and vomiting.  Genitourinary: Negative for dysuria, frequency and urgency.  Musculoskeletal: Negative for back pain and myalgias.  Skin: Negative for itching and rash.  Neurological: Negative for dizziness, tingling, tremors, focal weakness, seizures, weakness and headaches.  Psychiatric/Behavioral: Negative for depression. The patient is not nervous/anxious.    DRUG ALLERGIES:   Allergies  Allergen Reactions  . Latex Itching   VITALS:  Blood pressure 115/73, pulse 83, temperature 98.4 F (36.9 C), temperature source Oral, resp. rate 16, height 5\' 4"  (1.626 m), weight 88.9 kg, last menstrual period 02/12/2021, SpO2 99 %. PHYSICAL EXAMINATION:  Physical Exam  45 year old female lying in the bed comfortably without any acute distress Eyes pupils equal round reactive to light accommodation Lungs clear to auscultation bilaterally no wheezing, rales, rhonchi or crepitation Cardiovascular S1-S2 normal, no murmur rales or gallop Abdomen soft, benign Neurologically alert and oriented  nonfocal exam Skin no rash or lesion Psych normal mood and affect LABORATORY PANEL:  Female CBC Recent Labs  Lab 02/26/21 0539  WBC 9.3  HGB 11.2*  HCT 32.9*  PLT 259   ------------------------------------------------------------------------------------------------------------------ Chemistries  Recent Labs  Lab 02/26/21 0539  NA 140  K 3.0*  CL 100  CO2 28  GLUCOSE 86  BUN 11  CREATININE 0.56  CALCIUM 8.1*  AST 172*  ALT 241*  ALKPHOS 170*  BILITOT 3.8*   RADIOLOGY:  No results found. ASSESSMENT AND PLAN:  45 y.o. female with medical history significant of depression, anxiety, adjustment disorder, endometriosis, ectopic pregnancy, miscarriage admitted for nausea, vomiting, diarrhea and abdominal pain and was found to have choledocholithiasis and acute cholecystitis  Choledocholithiasis and acute cholecystitis:  Status post ERCP on 6/7 with removal of CBD stone.   Continue IV Zosyn for now.  Dr.Sakai planning to do cholecystectomy on 6/9 per report. -As needed morphine for pain, Zofran for nausea -Negative blood culture -Clear liquid diet  Transaminitis -Avoid using Tylenol -Monitor LFTs  Adjustment disorder with mixed anxiety and depressed mood -Prozac  GERD: -protonix  Body mass index is 33.64 kg/m.  Net IO Since Admission: 3,757.71 mL [02/26/21 1547]      Status is: Inpatient  Remains inpatient appropriate because:Ongoing diagnostic testing needed not appropriate for outpatient work up   Dispo: The patient is from: Home              Anticipated d/c is to: Home              Patient currently is not medically stable to d/c.   Difficult to place patient No   DVT prophylaxis:       SCDs Start: 02/25/21  0737     Family Communication: discussed with patient   All the records are reviewed and case discussed with Care Management/Social Worker. Management plans discussed with the patient, nursing and they are in agreement.  CODE STATUS:  Full Code Level of care: Med-Surg  TOTAL TIME TAKING CARE OF THIS PATIENT: 35 minutes.   More than 50% of the time was spent in counseling/coordination of care: YES  POSSIBLE D/C IN 3-4 DAYS, DEPENDING ON CLINICAL CONDITION.  And surgery/postop recovery   Delfino Lovett M.D on 02/26/2021 at 3:47 PM  [facility]   CC: Primary care physician; Allegra Grana, FNP  Note: This dictation was prepared with Dragon dictation along with smaller phrase technology. Any transcriptional errors that result from this process are unintentional.

## 2021-02-26 NOTE — Telephone Encounter (Signed)
I called patient to see how she was doing since she chose to cancel 8:30 appointment today.

## 2021-02-26 NOTE — Telephone Encounter (Signed)
I called patient to see how she was doing. She cancelled her appointment for today.

## 2021-02-27 ENCOUNTER — Encounter: Payer: Self-pay | Admitting: Internal Medicine

## 2021-02-27 ENCOUNTER — Inpatient Hospital Stay: Payer: 59 | Admitting: Anesthesiology

## 2021-02-27 ENCOUNTER — Encounter
Admission: EM | Disposition: A | Discharge status: Home / Self Care | Payer: Self-pay | Source: Home / Self Care | Attending: Internal Medicine

## 2021-02-27 DIAGNOSIS — E876 Hypokalemia: Secondary | ICD-10-CM

## 2021-02-27 LAB — BASIC METABOLIC PANEL
Anion gap: 9 (ref 5–15)
BUN: 9 mg/dL (ref 6–20)
CO2: 30 mmol/L (ref 22–32)
Calcium: 7.7 mg/dL — ABNORMAL LOW (ref 8.9–10.3)
Chloride: 98 mmol/L (ref 98–111)
Creatinine, Ser: 0.6 mg/dL (ref 0.44–1.00)
GFR, Estimated: >60 mL/min (ref >60)
Glucose, Bld: 88 mg/dL (ref 70–99)
Potassium: 3.1 mmol/L — ABNORMAL LOW (ref 3.5–5.1)
Sodium: 137 mmol/L (ref 135–145)

## 2021-02-27 LAB — CBC
HCT: 33.9 % — ABNORMAL LOW (ref 36.0–46.0)
Hemoglobin: 11.3 g/dL — ABNORMAL LOW (ref 12.0–15.0)
MCH: 28 pg (ref 26.0–34.0)
MCHC: 33.3 g/dL (ref 30.0–36.0)
MCV: 84.1 fL (ref 80.0–100.0)
Platelets: 291 10*3/uL (ref 150–400)
RBC: 4.03 MIL/uL (ref 3.87–5.11)
RDW: 12.9 % (ref 11.5–15.5)
WBC: 10.3 10*3/uL (ref 4.0–10.5)
nRBC: 0 % (ref 0.0–0.2)

## 2021-02-27 LAB — HEPATIC FUNCTION PANEL
ALT: 185 U/L — ABNORMAL HIGH (ref 0–44)
AST: 110 U/L — ABNORMAL HIGH (ref 15–41)
Albumin: 2.3 g/dL — ABNORMAL LOW (ref 3.5–5.0)
Alkaline Phosphatase: 200 U/L — ABNORMAL HIGH (ref 38–126)
Bilirubin, Direct: 2 mg/dL — ABNORMAL HIGH (ref 0.0–0.2)
Indirect Bilirubin: 1.5 mg/dL — ABNORMAL HIGH (ref 0.3–0.9)
Total Bilirubin: 3.5 mg/dL — ABNORMAL HIGH (ref 0.3–1.2)
Total Protein: 5.7 g/dL — ABNORMAL LOW (ref 6.5–8.1)

## 2021-02-27 LAB — MAGNESIUM: Magnesium: 1.8 mg/dL (ref 1.7–2.4)

## 2021-02-27 LAB — GLUCOSE, CAPILLARY: Glucose-Capillary: 95 mg/dL (ref 70–99)

## 2021-02-27 SURGERY — CHOLECYSTECTOMY, ROBOT-ASSISTED, LAPAROSCOPIC
Anesthesia: General

## 2021-02-27 MED ORDER — FENTANYL CITRATE (PF) 100 MCG/2ML IJ SOLN
INTRAMUSCULAR | Status: DC | PRN
  Administered 2021-02-27 (×2): 50 ug via INTRAVENOUS

## 2021-02-27 MED ORDER — INDOCYANINE GREEN 25 MG IV SOLR
1.2500 mg | Freq: Once | INTRAVENOUS | Status: AC
  Administered 2021-02-27: 1.25 mg via INTRAVENOUS
  Filled 2021-02-27: qty 0.5

## 2021-02-27 MED ORDER — DEXMEDETOMIDINE (PRECEDEX) IN NS 20 MCG/5ML (4 MCG/ML) IV SYRINGE
PREFILLED_SYRINGE | INTRAVENOUS | Status: DC | PRN
  Administered 2021-02-27: 4 ug via INTRAVENOUS
  Administered 2021-02-27: 8 ug via INTRAVENOUS

## 2021-02-27 MED ORDER — EPINEPHRINE PF 1 MG/ML IJ SOLN
INTRAMUSCULAR | Status: AC
  Filled 2021-02-27: qty 1

## 2021-02-27 MED ORDER — LACTATED RINGERS IV SOLN: INTRAVENOUS | Status: DC

## 2021-02-27 MED ORDER — MIDAZOLAM HCL 2 MG/2ML IJ SOLN
INTRAMUSCULAR | Status: AC
  Filled 2021-02-27: qty 2

## 2021-02-27 MED ORDER — FENTANYL CITRATE (PF) 100 MCG/2ML IJ SOLN
INTRAMUSCULAR | Status: AC
  Administered 2021-02-27: 25 ug via INTRAVENOUS
  Filled 2021-02-27: qty 2

## 2021-02-27 MED ORDER — DEXMEDETOMIDINE (PRECEDEX) IN NS 20 MCG/5ML (4 MCG/ML) IV SYRINGE
PREFILLED_SYRINGE | INTRAVENOUS | Status: AC
  Filled 2021-02-27: qty 5

## 2021-02-27 MED ORDER — ROCURONIUM BROMIDE 100 MG/10ML IV SOLN
INTRAVENOUS | Status: DC | PRN
  Administered 2021-02-27: 50 mg via INTRAVENOUS
  Administered 2021-02-27: 20 mg via INTRAVENOUS
  Administered 2021-02-27: 30 mg via INTRAVENOUS

## 2021-02-27 MED ORDER — ACETAMINOPHEN 10 MG/ML IV SOLN
INTRAVENOUS | Status: AC
  Filled 2021-02-27: qty 100

## 2021-02-27 MED ORDER — FENTANYL CITRATE (PF) 100 MCG/2ML IJ SOLN
INTRAMUSCULAR | Status: AC
  Filled 2021-02-27: qty 2

## 2021-02-27 MED ORDER — PIPERACILLIN-TAZOBACTAM 3.375 G IVPB
INTRAVENOUS | Status: AC
  Filled 2021-02-27: qty 50

## 2021-02-27 MED ORDER — LIDOCAINE HCL (CARDIAC) PF 100 MG/5ML IV SOSY
PREFILLED_SYRINGE | INTRAVENOUS | Status: DC | PRN
  Administered 2021-02-27: 80 mg via INTRAVENOUS

## 2021-02-27 MED ORDER — ONDANSETRON HCL 4 MG/2ML IJ SOLN
INTRAMUSCULAR | Status: DC | PRN
  Administered 2021-02-27: 4 mg via INTRAVENOUS

## 2021-02-27 MED ORDER — DEXAMETHASONE SODIUM PHOSPHATE 10 MG/ML IJ SOLN
INTRAMUSCULAR | Status: DC | PRN
  Administered 2021-02-27: 10 mg via INTRAVENOUS

## 2021-02-27 MED ORDER — MICROFIBRILLAR COLL HEMOSTAT EX POWD
CUTANEOUS | Status: DC | PRN
  Administered 2021-02-27: 3 g via TOPICAL

## 2021-02-27 MED ORDER — LIDOCAINE-EPINEPHRINE 0.5 %-1:200000 IJ SOLN
INTRAMUSCULAR | Status: AC
  Filled 2021-02-27: qty 1

## 2021-02-27 MED ORDER — ONDANSETRON HCL 4 MG/2ML IJ SOLN: 4.0000 mg | Freq: Once | INTRAMUSCULAR | Status: DC | PRN

## 2021-02-27 MED ORDER — ACETAMINOPHEN 10 MG/ML IV SOLN
INTRAVENOUS | Status: DC | PRN
  Administered 2021-02-27: 1000 mg via INTRAVENOUS

## 2021-02-27 MED ORDER — PROPOFOL 10 MG/ML IV BOLUS
INTRAVENOUS | Status: DC | PRN
  Administered 2021-02-27: 170 mg via INTRAVENOUS

## 2021-02-27 MED ORDER — FENTANYL CITRATE (PF) 100 MCG/2ML IJ SOLN
25.0000 ug | INTRAMUSCULAR | Status: DC | PRN
  Administered 2021-02-27 (×4): 25 ug via INTRAVENOUS

## 2021-02-27 MED ORDER — LIDOCAINE-EPINEPHRINE (PF) 1 %-1:200000 IJ SOLN
INTRAMUSCULAR | Status: DC | PRN
  Administered 2021-02-27: 15 mL via INTRAMUSCULAR

## 2021-02-27 MED ORDER — SUGAMMADEX SODIUM 200 MG/2ML IV SOLN
INTRAVENOUS | Status: DC | PRN
  Administered 2021-02-27: 200 mg via INTRAVENOUS

## 2021-02-27 MED ORDER — LIDOCAINE HCL (PF) 1 % IJ SOLN
INTRAMUSCULAR | Status: AC
  Filled 2021-02-27: qty 30

## 2021-02-27 MED ORDER — PROPOFOL 10 MG/ML IV BOLUS
INTRAVENOUS | Status: AC
  Filled 2021-02-27: qty 20

## 2021-02-27 MED ORDER — POTASSIUM CHLORIDE 10 MEQ/100ML IV SOLN
10.0000 meq | INTRAVENOUS | Status: AC
  Administered 2021-02-27 (×3): 10 meq via INTRAVENOUS
  Filled 2021-02-27 (×3): qty 100

## 2021-02-27 MED ORDER — ENOXAPARIN SODIUM 40 MG/0.4ML IJ SOSY
40.0000 mg | PREFILLED_SYRINGE | INTRAMUSCULAR | Status: DC
  Administered 2021-02-28: 40 mg via SUBCUTANEOUS
  Filled 2021-02-27 (×2): qty 0.4

## 2021-02-27 MED ORDER — BUPIVACAINE HCL (PF) 0.5 % IJ SOLN
INTRAMUSCULAR | Status: AC
  Filled 2021-02-27: qty 30

## 2021-02-27 MED ORDER — MIDAZOLAM HCL 2 MG/2ML IJ SOLN
INTRAMUSCULAR | Status: DC | PRN
  Administered 2021-02-27: 2 mg via INTRAVENOUS

## 2021-02-27 SURGICAL SUPPLY — 60 items
"PENCIL ELECTRO HAND CTR " (MISCELLANEOUS) ×1 IMPLANT
ANCHOR TIS RET SYS 235ML (MISCELLANEOUS) ×2 IMPLANT
APPLICATOR ARISTA FLEXITIP XL (MISCELLANEOUS) ×1 IMPLANT
BAG INFUSER PRESSURE 100CC (MISCELLANEOUS) ×1 IMPLANT
BLADE SURG SZ11 CARB STEEL (BLADE) ×2 IMPLANT
BULB RESERV EVAC DRAIN JP 100C (MISCELLANEOUS) ×1 IMPLANT
CANNULA REDUC XI 12-8 STAPL (CANNULA) ×1
CANNULA REDUCER 12-8 DVNC XI (CANNULA) ×1 IMPLANT
CHLORAPREP W/TINT 26 (MISCELLANEOUS) ×2 IMPLANT
CLIP VESOLOCK MED LG 6/CT (CLIP) ×2 IMPLANT
COVER TIP SHEARS 8 DVNC (MISCELLANEOUS) ×1 IMPLANT
COVER TIP SHEARS 8MM DA VINCI (MISCELLANEOUS) ×1
COVER WAND RF STERILE (DRAPES) ×2 IMPLANT
DECANTER SPIKE VIAL GLASS SM (MISCELLANEOUS) ×4 IMPLANT
DEFOGGER SCOPE WARMER CLEARIFY (MISCELLANEOUS) ×2 IMPLANT
DERMABOND ADVANCED (GAUZE/BANDAGES/DRESSINGS) ×1
DERMABOND ADVANCED .7 DNX12 (GAUZE/BANDAGES/DRESSINGS) ×1 IMPLANT
DRAIN CHANNEL JP 15F RND 16 (MISCELLANEOUS) ×1 IMPLANT
DRAPE ARM DVNC X/XI (DISPOSABLE) ×4 IMPLANT
DRAPE COLUMN DVNC XI (DISPOSABLE) ×1 IMPLANT
DRAPE DA VINCI XI ARM (DISPOSABLE) ×4
DRAPE DA VINCI XI COLUMN (DISPOSABLE) ×1
DRAPE LAPAROSCOPIC ABDOMINAL (DRAPES) ×1 IMPLANT
ELECT CAUTERY BLADE 6.4 (BLADE) ×2 IMPLANT
ELECT REM PT RETURN 9FT ADLT (ELECTROSURGICAL) ×2
ELECTRODE REM PT RTRN 9FT ADLT (ELECTROSURGICAL) ×1 IMPLANT
GLOVE SURG SYN 6.5 ES PF (GLOVE) ×4 IMPLANT
GLOVE SURG SYN 6.5 PF PI (GLOVE) ×2 IMPLANT
GLOVE SURG UNDER POLY LF SZ7 (GLOVE) ×4 IMPLANT
GOWN STRL REUS W/ TWL LRG LVL3 (GOWN DISPOSABLE) ×3 IMPLANT
GOWN STRL REUS W/TWL LRG LVL3 (GOWN DISPOSABLE) ×3
HEMOSTAT ARISTA ABSORB 3G PWDR (HEMOSTASIS) ×1 IMPLANT
IRRIGATOR SUCT 8 DISP DVNC XI (IRRIGATION / IRRIGATOR) IMPLANT
IRRIGATOR SUCTION 8MM XI DISP (IRRIGATION / IRRIGATOR) ×1
IV NS 1000ML (IV SOLUTION) ×1
IV NS 1000ML BAXH (IV SOLUTION) IMPLANT
LABEL OR SOLS (LABEL) ×2 IMPLANT
MANIFOLD NEPTUNE II (INSTRUMENTS) ×2 IMPLANT
NDL INSUFFLATION 14GA 120MM (NEEDLE) ×1 IMPLANT
NEEDLE HYPO 22GX1.5 SAFETY (NEEDLE) ×2 IMPLANT
NEEDLE INSUFFLATION 14GA 120MM (NEEDLE) ×2 IMPLANT
NS IRRIG 500ML POUR BTL (IV SOLUTION) ×2 IMPLANT
OBTURATOR OPTICAL STANDARD 8MM (TROCAR) ×1
OBTURATOR OPTICAL STND 8 DVNC (TROCAR) ×1
OBTURATOR OPTICALSTD 8 DVNC (TROCAR) ×1 IMPLANT
PENCIL ELECTRO HAND CTR (MISCELLANEOUS) ×2 IMPLANT
SEAL CANN UNIV 5-8 DVNC XI (MISCELLANEOUS) ×3 IMPLANT
SEAL XI 5MM-8MM UNIVERSAL (MISCELLANEOUS) ×3
SET TUBE SMOKE EVAC HIGH FLOW (TUBING) ×2 IMPLANT
SOLUTION ELECTROLUBE (MISCELLANEOUS) ×2 IMPLANT
SPONGE DRAIN TRACH 4X4 STRL 2S (GAUZE/BANDAGES/DRESSINGS) ×1 IMPLANT
STAPLER CANNULA SEAL DVNC XI (STAPLE) ×1 IMPLANT
STAPLER CANNULA SEAL XI (STAPLE) ×1
SUT ETHILON 3-0 FS-10 30 BLK (SUTURE) ×2
SUT MNCRL 4-0 (SUTURE) ×2
SUT MNCRL 4-0 27XMFL (SUTURE) ×2
SUT VICRYL 0 AB UR-6 (SUTURE) ×2 IMPLANT
SUTURE EHLN 3-0 FS-10 30 BLK (SUTURE) IMPLANT
SUTURE MNCRL 4-0 27XMF (SUTURE) ×2 IMPLANT
SYR 30ML LL (SYRINGE) ×1 IMPLANT

## 2021-02-27 NOTE — Transfer of Care (Signed)
Immediate Anesthesia Transfer of Care Note  Patient: Jamie Gordon  Procedure(s) Performed: XI ROBOTIC ASSISTED LAPAROSCOPIC CHOLECYSTECTOMY  Patient Location: PACU  Anesthesia Type:General  Level of Consciousness: awake, drowsy and patient cooperative  Airway & Oxygen Therapy: Patient Spontanous Breathing and Patient connected to face mask oxygen  Post-op Assessment: Report given to RN and Post -op Vital signs reviewed and stable  Post vital signs: Reviewed and stable  Last Vitals:  Vitals Value Taken Time  BP 121/77 1849 02/27/2021  Temp 97.0   Pulse 95   Resp 18   SpO2 99     Last Pain:  Vitals:   02/27/21 1532  TempSrc: Temporal  PainSc: 0-No pain      Patients Stated Pain Goal: 0 (02/27/21 1532)  Complications: No notable events documented.

## 2021-02-27 NOTE — Anesthesia Procedure Notes (Signed)
Procedure Name: Intubation Date/Time: 02/27/2021 3:59 PM Performed by: Elmarie Mainland, CRNA Pre-anesthesia Checklist: Patient identified, Emergency Drugs available, Suction available and Patient being monitored Patient Re-evaluated:Patient Re-evaluated prior to induction Oxygen Delivery Method: Circle system utilized Preoxygenation: Pre-oxygenation with 100% oxygen Induction Type: IV induction Ventilation: Mask ventilation without difficulty and Oral airway inserted - appropriate to patient size Laryngoscope Size: McGraph and 3 Grade View: Grade III Tube type: Oral Number of attempts: 1 Airway Equipment and Method: Stylet, Oral airway and Video-laryngoscopy Placement Confirmation: ETT inserted through vocal cords under direct vision, positive ETCO2 and breath sounds checked- equal and bilateral Secured at: 22 cm Tube secured with: Tape Dental Injury: Teeth and Oropharynx as per pre-operative assessment

## 2021-02-27 NOTE — Interval H&P Note (Signed)
History and Physical Interval Note:  02/27/2021 12:41 PM  Jamie Gordon  has presented today for surgery, with the diagnosis of Gallbladder Robotic.  The various methods of treatment have been discussed with the patient and family. After consideration of risks, benefits and other options for treatment, the patient has consented to  Procedure(s): XI ROBOTIC ASSISTED LAPAROSCOPIC CHOLECYSTECTOMY (N/A) as a surgical intervention.  The patient's history has been reviewed, patient examined, no change in status, stable for surgery.  I have reviewed the patient's chart and labs.  Questions were answered to the patient's satisfaction.     Lyndie Vanderloop Tonna Boehringer

## 2021-02-27 NOTE — Progress Notes (Signed)
1        [place] at [facility]   PATIENT NAME: Jamie Gordon    MR#:  [id number]  DATE OF BIRTH:  05/16/1976  SUBJECTIVE:  CHIEF COMPLAINT:   Chief Complaint  Patient presents with   Abdominal Pain  Denies any new complaints.  Waiting for surgery.  Inquiring when she will be able to go home REVIEW OF SYSTEMS:  Review of Systems  Constitutional:  Negative for diaphoresis, fever, malaise/fatigue and weight loss.  HENT:  Negative for ear discharge, ear pain, hearing loss, nosebleeds, sore throat and tinnitus.   Eyes:  Negative for blurred vision and pain.  Respiratory:  Negative for cough, hemoptysis, shortness of breath and wheezing.   Cardiovascular:  Negative for chest pain, palpitations, orthopnea and leg swelling.  Gastrointestinal:  Negative for abdominal pain, blood in stool, constipation, diarrhea, heartburn, nausea and vomiting.  Genitourinary:  Negative for dysuria, frequency and urgency.  Musculoskeletal:  Negative for back pain and myalgias.  Skin:  Negative for itching and rash.  Neurological:  Negative for dizziness, tingling, tremors, focal weakness, seizures, weakness and headaches.  Psychiatric/Behavioral:  Negative for depression. The patient is not nervous/anxious.   DRUG ALLERGIES:   Allergies  Allergen Reactions   Latex Itching   VITALS:  Blood pressure 127/79, pulse 91, temperature 100.3 F (37.9 C), temperature source Temporal, resp. rate 20, height 5\' 4"  (1.626 m), weight 88.9 kg, last menstrual period 02/12/2021, SpO2 99 %. PHYSICAL EXAMINATION:  Physical Exam  45 year old female lying in the bed comfortably without any acute distress Eyes pupils equal round reactive to light accommodation Lungs clear to auscultation bilaterally no wheezing, rales, rhonchi or crepitation Cardiovascular S1-S2 normal, no murmur rales or gallop Abdomen soft, benign Neurologically alert and oriented nonfocal exam Skin no rash or lesion Psych normal mood and  affect LABORATORY PANEL:  Female CBC Recent Labs  Lab 02/27/21 0549  WBC 10.3  HGB 11.3*  HCT 33.9*  PLT 291    ------------------------------------------------------------------------------------------------------------------ Chemistries  Recent Labs  Lab 02/27/21 0549  NA 137  K 3.1*  CL 98  CO2 30  GLUCOSE 88  BUN 9  CREATININE 0.60  CALCIUM 7.7*  MG 1.8  AST 110*  ALT 185*  ALKPHOS 200*  BILITOT 3.5*    RADIOLOGY:  No results found. ASSESSMENT AND PLAN:  45 y.o. female with medical history significant of depression, anxiety, adjustment disorder, endometriosis, ectopic pregnancy, miscarriage admitted for nausea, vomiting, diarrhea and abdominal pain and was found to have choledocholithiasis and acute cholecystitis  Choledocholithiasis and acute cholecystitis:  Status post ERCP on 6/7 with removal of CBD stone.   Continue IV Zosyn for now.  Plan cholecystectomy today.  N.p.o. for now  Hypokalemia Replete and recheck, check magnesium   Transaminitis -Avoid using Tylenol -Improving LFTs   Adjustment disorder with mixed anxiety and depressed mood -Prozac   GERD: -protonix   Body mass index is 33.64 kg/m.  Net IO Since Admission: 4,587.71 mL [02/27/21 1641]      Status is: Inpatient  Remains inpatient appropriate because:Ongoing diagnostic testing needed not appropriate for outpatient work up.  Undergoing surgery today   Dispo: The patient is from: Home              Anticipated d/c is to: Home              Patient currently is not medically stable to d/c.   Difficult to place patient No   DVT prophylaxis:  SCDs Start: 02/25/21 0944     Family Communication: discussed with patient   All the records are reviewed and case discussed with Care Management/Social Worker. Management plans discussed with the patient, nursing and they are in agreement.  CODE STATUS: Full Code Level of care: Med-Surg  TOTAL TIME TAKING CARE OF THIS PATIENT:  35 minutes.   More than 50% of the time was spent in counseling/coordination of care: YES  POSSIBLE D/C IN 2-3 DAYS, DEPENDING ON CLINICAL CONDITION.  And surgery/postop recovery   Delfino Lovett M.D on 02/27/2021 at 4:41 PM  [facility]   CC: Primary care physician; Allegra Grana, FNP  Note: This dictation was prepared with Dragon dictation along with smaller phrase technology. Any transcriptional errors that result from this process are unintentional.

## 2021-02-27 NOTE — Anesthesia Preprocedure Evaluation (Signed)
Anesthesia Evaluation  Patient identified by MRN, date of birth, ID band Patient awake    Reviewed: Allergy & Precautions, H&P , NPO status , Patient's Chart, lab work & pertinent test results, reviewed documented beta blocker date and time   Airway Mallampati: II  TM Distance: >3 FB Neck ROM: full    Dental  (+) Teeth Intact   Pulmonary neg pulmonary ROS,    Pulmonary exam normal        Cardiovascular Exercise Tolerance: Good negative cardio ROS Normal cardiovascular exam Rhythm:regular Rate:Normal     Neuro/Psych PSYCHIATRIC DISORDERS negative neurological ROS     GI/Hepatic Neg liver ROS, GERD  Medicated,  Endo/Other  negative endocrine ROS  Renal/GU negative Renal ROS  negative genitourinary   Musculoskeletal   Abdominal   Peds  Hematology negative hematology ROS (+)   Anesthesia Other Findings Past Medical History: No date: Ectopic pregnancy     Comment:  s/p left partial salpingectomy No date: Endometriosis No date: Female fertility problem No date: Habitual abortion history, antepartum No date: Miscarriage     Comment:  x2 at 5 weeks Past Surgical History: 02/2013: ECTOPIC PREGNANCY SURGERY     Comment:  Dr. Greggory Keen 02/25/2021: ERCP; N/A     Comment:  Procedure: ENDOSCOPIC RETROGRADE               CHOLANGIOPANCREATOGRAPHY (ERCP);  Surgeon: Midge Minium,               MD;  [facility];  Service: Endoscopy;                Laterality: N/A; No date: WISDOM TOOTH EXTRACTION BMI    Body Mass Index: 33.64 kg/m     Reproductive/Obstetrics negative OB ROS                             Anesthesia Physical Anesthesia Plan  ASA: 2 and emergent  Anesthesia Plan: General ETT   Post-op Pain Management:    Induction:   PONV Risk Score and Plan: 4 or greater  Airway Management Planned:   Additional Equipment:   Intra-op Plan:   Post-operative Plan:    Informed Consent: I have reviewed the patients History and Physical, chart, labs and discussed the procedure including the risks, benefits and alternatives for the proposed anesthesia with the patient or authorized representative who has indicated his/her understanding and acceptance.     Dental Advisory Given  Plan Discussed with: CRNA  Anesthesia Plan Comments:         Anesthesia Quick Evaluation

## 2021-02-27 NOTE — Progress Notes (Signed)
[name], MD [address], [address], [place], [place], [address] [address], [address], [place], [place], [address] Phone: [phone]  Fax: [phone]   Subjective:  Patient resting in bed comfortably.  Denies any abdominal pain.  No nausea or vomiting.  No fever or chills.  Tolerating oral diet without difficulty.  Objective: Exam: Vital signs in last 24 hours: Vitals:   02/26/21 1520 02/26/21 2007 02/27/21 0409 02/27/21 1137  BP: 115/73 117/82 115/75 130/80  Pulse: 83 79 82 95  Resp:  18 16 16   Temp: 98.4 F (36.9 C) 98.7 F (37.1 C) 98.9 F (37.2 C) 98.3 F (36.8 C)  TempSrc: Oral Oral Oral Oral  SpO2: 99% 98% 96% 98%  Weight:      Height:       Weight change:   Intake/Output Summary (Last 24 hours) at 02/27/2021 1137 Last data filed at 02/26/2021 1832 Gross per 24 hour  Intake 2304.52 ml  Output --  Net 2304.52 ml    General: No acute distress, AAO x3 Abd: Soft, NT/ND, No HSM Skin: Warm, no rashes Neck: Supple, Trachea midline   Lab Results: Lab Results  Component Value Date   WBC 10.3 02/27/2021   HGB 11.3 (L) 02/27/2021   HCT 33.9 (L) 02/27/2021   MCV 84.1 02/27/2021   PLT 291 02/27/2021   Micro Results: Recent Results (from the past 240 hour(s))  Resp Panel by RT-PCR (Flu A&B, Covid) Nasopharyngeal Swab     Status: None   Collection Time: 02/25/21  9:07 AM   Specimen: Nasopharyngeal Swab; Nasopharyngeal(NP) swabs in vial transport medium  Result Value Ref Range Status   SARS Coronavirus 2 by RT PCR NEGATIVE NEGATIVE Final    Comment: (NOTE) SARS-CoV-2 target nucleic acids are NOT DETECTED.  The SARS-CoV-2 RNA is generally detectable in upper respiratory specimens during the acute phase of infection. The lowest concentration of SARS-CoV-2 viral copies this assay can detect is 138 copies/mL. A negative result does not preclude SARS-Cov-2 infection and should not be used as the sole basis for treatment or other patient management  decisions. A negative result may occur with  improper specimen collection/handling, submission of specimen other than nasopharyngeal swab, presence of viral mutation(s) within the areas targeted by this assay, and inadequate number of viral copies(<138 copies/mL). A negative result must be combined with clinical observations, patient history, and epidemiological information. The expected result is Negative.  Fact Sheet for Patients:  04/27/21  Fact Sheet for Healthcare Providers:  BloggerCourse.com  This test is no t yet approved or cleared by the [place] FDA and  has been authorized for detection and/or diagnosis of SARS-CoV-2 by FDA under an Emergency Use Authorization (EUA). This EUA will remain  in effect (meaning this test can be used) for the duration of the COVID-19 declaration under Section 564(b)(1) of the Act, 21 U.S.C.section 360bbb-3(b)(1), unless the authorization is terminated  or revoked sooner.       Influenza A by PCR NEGATIVE NEGATIVE Final   Influenza B by PCR NEGATIVE NEGATIVE Final    Comment: (NOTE) The Xpert Xpress SARS-CoV-2/FLU/RSV plus assay is intended as an aid in the diagnosis of influenza from Nasopharyngeal swab specimens and should not be used as a sole basis for treatment. Nasal washings and aspirates are unacceptable for Xpert Xpress SARS-CoV-2/FLU/RSV testing.  Fact Sheet for Patients: [place]  Fact Sheet for Healthcare Providers: BloggerCourse.com  This test is not yet approved or cleared by the [place] FDA and has  been authorized for detection and/or diagnosis of SARS-CoV-2 by FDA under an Emergency Use Authorization (EUA). This EUA will remain in effect (meaning this test can be used) for the duration of the COVID-19 declaration under Section 564(b)(1) of the Act, 21 U.S.C. section 360bbb-3(b)(1), unless the  authorization is terminated or revoked.  Performed at [facility], [address] [place], [place] [address]   Blood culture (routine x 2)     Status: None (Preliminary result)   Collection Time: 02/25/21  9:07 AM   Specimen: BLOOD  Result Value Ref Range Status   Specimen Description BLOOD RT FOREARM  Final   Special Requests   Final    BOTTLES DRAWN AEROBIC AND ANAEROBIC Blood Culture results may not be optimal due to an inadequate volume of blood received in culture bottles   Culture   Final    NO GROWTH 2 DAYS Performed at [facility], [address], [place], [place] [address]    Report Status PENDING  Incomplete  Blood culture (routine x 2)     Status: None (Preliminary result)   Collection Time: 02/25/21  9:15 AM   Specimen: BLOOD  Result Value Ref Range Status   Specimen Description BLOOD RIGHT [facility]  Final   Special Requests   Final    BOTTLES DRAWN AEROBIC AND ANAEROBIC Blood Culture adequate volume   Culture   Final    NO GROWTH 2 DAYS Performed at [facility], [address], [place], [place] [address]    Report Status PENDING  Incomplete   Studies/Results: DG C-Arm 1-60 Min-No Report  Result Date: 02/25/2021 Fluoroscopy was utilized by the requesting physician.  No radiographic interpretation.   Medications:  Scheduled Meds:  calcium carbonate  1 tablet Oral Daily   cholecalciferol  1,000 Units Oral Daily   FLUoxetine  40 mg Oral Daily   multivitamin with minerals  1 tablet Oral Daily   pantoprazole  20 mg Oral Daily   Continuous Infusions:  lactated ringers 75 mL/hr at 02/26/21 1401   piperacillin-tazobactam (ZOSYN)  IV 3.375 g (02/27/21 0534)   potassium chloride 10 mEq (02/27/21 1132)   PRN Meds:.ibuprofen, morphine injection, ondansetron (ZOFRAN) IV   Assessment: Principal Problem:   Choledocholithiasis Active Problems:   Adjustment disorder with mixed anxiety and depressed mood   Transaminitis   Acute  cholecystitis   GERD (gastroesophageal reflux disease)    Plan: Total bilirubin remains persistently elevated, although slightly improved from yesterday.  However, she remains clinically asymptomatic with no abdominal pain, fever or chills, no evidence of pancreatitis  Would not recommend repeat ERCP at this time since patient is clinically asymptomatic.  Above labs discussed with interventional endoscopist, Dr. Servando Snare as well  Patient is pending cholecystectomy which is potentially planned for today.  Would recommend continuing to follow liver enzymes  If patient develops any acute changes in clinical status such as worsening abdominal pain, fever chills, or any other questions or concerns, please page GI on-call for further evaluation  Will continue to follow   LOS: 2 days   [name], MD 02/27/2021, 11:37 AM

## 2021-02-28 ENCOUNTER — Other Ambulatory Visit: Payer: Self-pay | Admitting: Family

## 2021-02-28 ENCOUNTER — Other Ambulatory Visit: Payer: Self-pay

## 2021-02-28 DIAGNOSIS — F4323 Adjustment disorder with mixed anxiety and depressed mood: Secondary | ICD-10-CM

## 2021-02-28 LAB — GLUCOSE, CAPILLARY: Glucose-Capillary: 80 mg/dL (ref 70–99)

## 2021-02-28 LAB — BASIC METABOLIC PANEL
Anion gap: 13 (ref 5–15)
BUN: 10 mg/dL (ref 6–20)
CO2: 24 mmol/L (ref 22–32)
Calcium: 7.7 mg/dL — ABNORMAL LOW (ref 8.9–10.3)
Chloride: 100 mmol/L (ref 98–111)
Creatinine, Ser: 0.67 mg/dL (ref 0.44–1.00)
GFR, Estimated: >60 mL/min (ref >60)
Glucose, Bld: 87 mg/dL (ref 70–99)
Potassium: 3.6 mmol/L (ref 3.5–5.1)
Sodium: 137 mmol/L (ref 135–145)

## 2021-02-28 LAB — HEPATIC FUNCTION PANEL
ALT: 131 U/L — ABNORMAL HIGH (ref 0–44)
AST: 53 U/L — ABNORMAL HIGH (ref 15–41)
Albumin: 2.4 g/dL — ABNORMAL LOW (ref 3.5–5.0)
Alkaline Phosphatase: 189 U/L — ABNORMAL HIGH (ref 38–126)
Bilirubin, Direct: 0.6 mg/dL — ABNORMAL HIGH (ref 0.0–0.2)
Indirect Bilirubin: 1.3 mg/dL — ABNORMAL HIGH (ref 0.3–0.9)
Total Bilirubin: 1.9 mg/dL — ABNORMAL HIGH (ref 0.3–1.2)
Total Protein: 6 g/dL — ABNORMAL LOW (ref 6.5–8.1)

## 2021-02-28 LAB — CBC
HCT: 34.5 % — ABNORMAL LOW (ref 36.0–46.0)
Hemoglobin: 11.6 g/dL — ABNORMAL LOW (ref 12.0–15.0)
MCH: 29.1 pg (ref 26.0–34.0)
MCHC: 33.6 g/dL (ref 30.0–36.0)
MCV: 86.7 fL (ref 80.0–100.0)
Platelets: 342 10*3/uL (ref 150–400)
RBC: 3.98 MIL/uL (ref 3.87–5.11)
RDW: 13 % (ref 11.5–15.5)
WBC: 12.8 10*3/uL — ABNORMAL HIGH (ref 4.0–10.5)
nRBC: 0 % (ref 0.0–0.2)

## 2021-02-28 MED ORDER — IBUPROFEN 800 MG PO TABS
800.0000 mg | ORAL_TABLET | Freq: Three times a day (TID) | ORAL | 0 refills | Status: DC | PRN
  Filled 2021-02-28: qty 30, 10d supply, fill #0

## 2021-02-28 MED ORDER — ACETAMINOPHEN 325 MG PO TABS
650.0000 mg | ORAL_TABLET | Freq: Three times a day (TID) | ORAL | 0 refills | Status: AC | PRN
  Filled 2021-02-28: qty 40, 7d supply, fill #0

## 2021-02-28 MED ORDER — ACETAMINOPHEN 325 MG PO TABS
650.0000 mg | ORAL_TABLET | Freq: Four times a day (QID) | ORAL | Status: DC
  Administered 2021-02-28 – 2021-03-01 (×4): 650 mg via ORAL
  Filled 2021-02-28 (×4): qty 2

## 2021-02-28 MED ORDER — FLUOXETINE HCL 40 MG PO CAPS
ORAL_CAPSULE | Freq: Every day | ORAL | 3 refills | Status: DC
  Filled 2021-02-28: qty 90, 90d supply, fill #0
  Filled 2021-05-22: qty 90, 90d supply, fill #1
  Filled 2021-08-23: qty 90, 90d supply, fill #2
  Filled 2021-11-07: qty 90, 90d supply, fill #3

## 2021-02-28 MED ORDER — DOCUSATE SODIUM 100 MG PO CAPS
100.0000 mg | ORAL_CAPSULE | Freq: Two times a day (BID) | ORAL | 0 refills | Status: AC | PRN
  Filled 2021-02-28: qty 20, 10d supply, fill #0

## 2021-02-28 MED ORDER — CELECOXIB 200 MG PO CAPS
200.0000 mg | ORAL_CAPSULE | Freq: Every day | ORAL | Status: DC
  Administered 2021-02-28 – 2021-03-01 (×2): 200 mg via ORAL
  Filled 2021-02-28 (×2): qty 1

## 2021-02-28 MED ORDER — HYDROMORPHONE HCL 1 MG/ML IJ SOLN
0.5000 mg | INTRAMUSCULAR | Status: DC | PRN
Start: 2021-02-28 — End: 2021-03-01
  Administered 2021-02-28: 0.5 mg via INTRAVENOUS
  Filled 2021-02-28: qty 0.5

## 2021-02-28 MED ORDER — FLUOXETINE HCL 40 MG PO CAPS: 40.0000 mg | ORAL_CAPSULE | Freq: Every day | ORAL | Status: DC

## 2021-02-28 MED ORDER — HYDROCODONE-ACETAMINOPHEN 5-325 MG PO TABS
1.0000 | ORAL_TABLET | Freq: Four times a day (QID) | ORAL | 0 refills | Status: DC | PRN
  Filled 2021-02-28: qty 6, 2d supply, fill #0

## 2021-02-28 NOTE — Consult Note (Signed)
Pharmacy Antibiotic Note  Ivonne HILARIA TITSWORTH is a 45 y.o. female admitted on 02/25/2021 with  Choledocholithiasis .  Pharmacy has been consulted for Pip/tazo dosing.  Zosyn day 4, POD 3 ERCP (6/7), POD 1 Lap choly (6/9)  Plan: Continue Zosyn 3.375g IV q8h (4 hour infusion).  Height: 5\' 4"  (162.6 cm) Weight: 88.9 kg (195 lb 15.8 oz) IBW/kg (Calculated) : 54.7  Temp (24hrs), Avg:98.2 F (36.8 C), Min:97 F (36.1 C), Max:100.3 F (37.9 C)  Recent Labs  Lab 02/24/21 2226 02/26/21 0539 02/27/21 0549 02/28/21 0426  WBC 11.3* 9.3 10.3 12.8*  CREATININE 0.79 0.56 0.60 0.67     Estimated Creatinine Clearance: 97.9 mL/min (by C-G formula based on SCr of 0.67 mg/dL).    Allergies  Allergen Reactions   Latex Itching    Antimicrobials this admission: 6/7 pip/tazo >>   Dose adjustments this admission: None  Microbiology results: 6/7 BCx: pending  Thank you for allowing pharmacy to be a part of this patient's care.  8/7, PharmD, BCPS 02/28/2021 8:58 AM

## 2021-02-28 NOTE — Telephone Encounter (Signed)
PT called to give a followup on how she is doing and would like a callback asap.

## 2021-02-28 NOTE — Progress Notes (Signed)
1        [facility] at [facility]   PATIENT NAME: Jamie Gordon    MR#:  [id number]  DATE OF BIRTH:  Jan 26, 1976  SUBJECTIVE:  CHIEF COMPLAINT:   Chief Complaint  Patient presents with   Abdominal Pain  Eager to go home, some incisional pain REVIEW OF SYSTEMS:  Review of Systems  Constitutional:  Negative for diaphoresis, fever, malaise/fatigue and weight loss.  HENT:  Negative for ear discharge, ear pain, hearing loss, nosebleeds, sore throat and tinnitus.   Eyes:  Negative for blurred vision and pain.  Respiratory:  Negative for cough, hemoptysis, shortness of breath and wheezing.   Cardiovascular:  Negative for chest pain, palpitations, orthopnea and leg swelling.  Gastrointestinal:  Negative for abdominal pain, blood in stool, constipation, diarrhea, heartburn, nausea and vomiting.  Genitourinary:  Negative for dysuria, frequency and urgency.  Musculoskeletal:  Negative for back pain and myalgias.  Skin:  Negative for itching and rash.  Neurological:  Negative for dizziness, tingling, tremors, focal weakness, seizures, weakness and headaches.  Psychiatric/Behavioral:  Negative for depression. The patient is not nervous/anxious.   DRUG ALLERGIES:   Allergies  Allergen Reactions   Latex Itching   VITALS:  Blood pressure 130/85, pulse (!) 102, temperature 98.6 F (37 C), temperature source Oral, resp. rate 18, height 5\' 4"  (1.626 m), weight 88.9 kg, last menstrual period 02/12/2021, SpO2 98 %. PHYSICAL EXAMINATION:  Physical Exam  45 year old female lying in the bed comfortably without any acute distress Eyes pupils equal round reactive to light accommodation Lungs clear to auscultation bilaterally no wheezing, rales, rhonchi or crepitation Cardiovascular S1-S2 normal, no murmur rales or gallop Abdomen soft, benign.  Some incisional tenderness Neurologically alert and oriented nonfocal exam Skin no rash or lesion Psych normal mood and affect LABORATORY PANEL:   Female CBC Recent Labs  Lab 02/28/21 0426  WBC 12.8*  HGB 11.6*  HCT 34.5*  PLT 342    ------------------------------------------------------------------------------------------------------------------ Chemistries  Recent Labs  Lab 02/27/21 0549 02/28/21 0426  NA 137 137  K 3.1* 3.6  CL 98 100  CO2 30 24  GLUCOSE 88 87  BUN 9 10  CREATININE 0.60 0.67  CALCIUM 7.7* 7.7*  MG 1.8  --   AST 110* 53*  ALT 185* 131*  ALKPHOS 200* 189*  BILITOT 3.5* 1.9*    RADIOLOGY:  No results found. ASSESSMENT AND PLAN:  45 y.o. female with medical history significant of depression, anxiety, adjustment disorder, endometriosis, ectopic pregnancy, miscarriage admitted for nausea, vomiting, diarrhea and abdominal pain and was found to have choledocholithiasis and acute cholecystitis  Choledocholithiasis and acute cholecystitis:  Status post ERCP on 6/7 with removal of CBD stone.   Continue IV Zosyn for now.  Status post lap chole on 6/9 and recovering appropriately postop  Hypokalemia Repleted and resolved   Transaminitis -Avoid using Tylenol -Improving LFTs   Adjustment disorder with mixed anxiety and depressed mood -Prozac   GERD: -protonix   Body mass index is 33.64 kg/m.  Net IO Since Admission: 10,185.01 mL [02/28/21 1714]    Discussed with Dr. 04/30/21.  He will assume attending role at this point as patient is postop.  I will sign off as patient is medically stable whenever ready for discharge from Dr. Tonna Boehringer standpoint  Status is: Inpatient  Remains inpatient appropriate because:Ongoing diagnostic testing needed not appropriate for outpatient work up.       DVT prophylaxis:       enoxaparin (LOVENOX) injection 40 mg  Start: 02/28/21 1000 SCD's Start: 02/27/21 1954 SCDs Start: 02/25/21 0944     Family Communication: discussed with patient and Dr. Tonna Boehringer   All the records are reviewed and case discussed with Care Management/Social Worker. Management plans  discussed with the patient, nursing and they are in agreement.  CODE STATUS: Full Code Level of care: Med-Surg  TOTAL TIME TAKING CARE OF THIS PATIENT: 15 minutes.   More than 50% of the time was spent in counseling/coordination of care: YES    Delfino Lovett M.D on 02/28/2021 at 5:14 PM  [facility]   CC: Primary care physician; Allegra Grana, FNP  Note: This dictation was prepared with Dragon dictation along with smaller phrase technology. Any transcriptional errors that result from this process are unintentional.

## 2021-02-28 NOTE — Op Note (Signed)
Preoperative diagnosis:  acute and cholecystitis  Postoperative diagnosis: same as above  Procedure: Robotic assisted Laparoscopic Cholecystectomy.   Anesthesia: GETA   Surgeon: Sung Amabile  Specimen: Gallbladder  Complications: None  EBL: 73mL  Wound Classification: Clean Contaminated  Indications: see HPI  Findings: Extremely inflamed gallbladder with thick rind and omental adhesions Cystic duct and artery identified, ligated and divided, clips remained intact at end of procedure Adequate hemostasis  Description of procedure:  The patient was placed on the operating table in the supine position. SCDs placed, pre-op abx administered.  General anesthesia was induced and OG tube placed by anesthesia. A time-out was completed verifying correct patient, procedure, site, positioning, and implant(s) and/or special equipment prior to beginning this procedure. The abdomen was prepped and draped in the usual sterile fashion.    Veress needle was placed at the Palmer's point and insufflation was started after confirming a positive saline drop test and no immediate increase in abdominal pressure.  After reaching 15 mm, the Veress needle was removed and a 8 mm port was placed via optiview technique under umbilicus measured 31mm from gallbladder.  The abdomen was inspected and no abnormalities or injuries were found.  Under direct vision, ports were placed in the following locations: One 12 mm patient left of the umbilicus, 8cm from the optiviewed port, one 8 mm port placed to the patient right of the umbilical port 8 cm apart.  1 additional 8 mm port placed lateral to the 76mm port.  Once ports were placed, The table was placed in the reverse Trendelenburg position with the right side up. The Xi platform was brought into the operative field and docked to the ports successfully.  An endoscope was placed through the umbilical port, fenestrated grasper through the adjacent patient right port, prograsp  to the far patient left port, and then a hook cautery in the left port.  Omentum completely encapsulated the gallbladder with thick adhesions to it.  This was gently separated using a combination of electrocautery and blunt dissection.  Afterwards, gallbladder noted to be severely inflamed with very thick rind.  The dome of the gallbladder was grasped with prograsp, passed and retracted over the dome of the liver. The peritoneum overlying the gallbladder infundibulum was then meticulously dissected using combination of [place] dissector and electrocautery hook and the cystic duct and cystic artery eventually identified.   The cystic duct and cystic artery clipped and divided close to the gallbladder.     The gallbladder was then attempted to be dissected from its peritoneal and liver bed attachments by electrocautery.  Posterior aspect was densely adherent to the liver, and a portion of the posterior wall had to be left behind in order to prevent extensive damage to the liver bed and possible uncontrolled bleeding.  The mucosa of posterior wall that was left behind was extensively electrocauterized.  Arista powder was then extensively applied to the dissected area to further ensure hemostasis.  15 [place] Blake drain was then inserted through the right lower quadrant port and placed over the gallbladder fossa, secured to skin using 3-0 nylon.  Hemostasis was checked prior to removing the hook cautery and the Endo Catch bag was then placed through the 12 mm port and the gallbladder was removed.  The gallbladder was passed off the table as a specimen. There was no evidence of bleeding from the gallbladder fossa or cystic artery or leakage of the bile from the cystic duct stump. The 12 mm port site closed with  PMI using 0 vicryl under direct vision.  Abdomen desufflated and secondary trocars were removed under direct vision. No bleeding was noted. All skin incisions then closed with subcuticular sutures of 4-0  monocryl and dressed with topical skin adhesive. The orogastric tube was removed and patient extubated.  The patient tolerated the procedure well and was taken to the postanesthesia care unit in stable condition.  All sponge and instrument count correct at end of procedure.

## 2021-02-28 NOTE — Progress Notes (Signed)
[name], MD [address], [address], [place], [place], [address] [address], [place], [place], [address] Phone: [phone]  Fax: [phone]   Subjective: Patient underwent cholecystectomy yesterday.  Denies any nausea or vomiting.  Does report some dull abdominal discomfort since then.  No fever or chills.   Objective: Exam: Vital signs in last 24 hours: Vitals:   02/27/21 2249 02/28/21 0424 02/28/21 0907 02/28/21 1147  BP: 132/86 124/80 134/90 130/85  Pulse: 76 73 86 (!) 102  Resp: 18 18    Temp: 98.2 F (36.8 C) 97.7 F (36.5 C) 98.1 F (36.7 C) 98.6 F (37 C)  TempSrc:  Oral  Oral  SpO2: 95% 99% 98%   Weight:      Height:       Weight change:   Intake/Output Summary (Last 24 hours) at 02/28/2021 1358 Last data filed at 02/28/2021 1300 Gross per 24 hour  Intake 5714.3 ml  Output 67 ml  Net 5647.3 ml    General: No acute distress, AAO x3 Head:  Normocephalic and atraumatic.Abd: Soft, NT/ND, No HSM Neck: Supple, Trachea midline Eyes:   No icterus.   Conjunctiva pink. PERRLA. Ears:  Normal auditory acuity. Lungs: Respirations even and unlabored. Lungs clear to auscultation bilaterally.   No wheezes, crackles, or rhonchi.  Heart:  No Lower extremity edema Abdomen:  Soft, nondistended, nontender. No appreciable masses or hepatomegaly.  No rebound or guarding.  Skin: Warm, no rashes, Intact without significant lesions or rashes. Neurologic:  Alert and oriented x3;  grossly normal neurologically. Cervical Nodes:  No significant cervical adenopathy. Psych:  Alert and cooperative. Normal affect.  Lab Results: Lab Results  Component Value Date   WBC 12.8 (H) 02/28/2021   HGB 11.6 (L) 02/28/2021   HCT 34.5 (L) 02/28/2021   MCV 86.7 02/28/2021   PLT 342 02/28/2021   Micro Results: Recent Results (from the past 240 hour(s))  Resp Panel by RT-PCR (Flu A&B, Covid) Nasopharyngeal Swab     Status: None   Collection Time: 02/25/21  9:07 AM    Specimen: Nasopharyngeal Swab; Nasopharyngeal(NP) swabs in vial transport medium  Result Value Ref Range Status   SARS Coronavirus 2 by RT PCR NEGATIVE NEGATIVE Final    Comment: (NOTE) SARS-CoV-2 target nucleic acids are NOT DETECTED.  The SARS-CoV-2 RNA is generally detectable in upper respiratory specimens during the acute phase of infection. The lowest concentration of SARS-CoV-2 viral copies this assay can detect is 138 copies/mL. A negative result does not preclude SARS-Cov-2 infection and should not be used as the sole basis for treatment or other patient management decisions. A negative result may occur with  improper specimen collection/handling, submission of specimen other than nasopharyngeal swab, presence of viral mutation(s) within the areas targeted by this assay, and inadequate number of viral copies(<138 copies/mL). A negative result must be combined with clinical observations, patient history, and epidemiological information. The expected result is Negative.  Fact Sheet for Patients:  BloggerCourse.com  Fact Sheet for Healthcare Providers:  [employer]ious[employer]ker.it  This test is no t yet approved or cleared by the [place] FDA and  has been authorized for detection and/or diagnosis of SARS-CoV-2 by FDA under an Emergency Use Authorization (EUA). This EUA will remain  in effect (meaning this test can be used) for the duration of the COVID-19 declaration under Section 564(b)(1) of the Act, 21 U.S.C.section 360bbb-3(b)(1), unless the authorization is terminated  or revoked sooner.       Influenza A by  PCR NEGATIVE NEGATIVE Final   Influenza B by PCR NEGATIVE NEGATIVE Final    Comment: (NOTE) The Xpert Xpress SARS-CoV-2/FLU/RSV plus assay is intended as an aid in the diagnosis of influenza from Nasopharyngeal swab specimens and should not be used as a sole basis for treatment. Nasal washings and aspirates are  unacceptable for Xpert Xpress SARS-CoV-2/FLU/RSV testing.  Fact Sheet for Patients: BloggerCourse.com  Fact Sheet for Healthcare Providers: SeriousBroker.it  This test is not yet approved or cleared by the [place] FDA and has been authorized for detection and/or diagnosis of SARS-CoV-2 by FDA under an Emergency Use Authorization (EUA). This EUA will remain in effect (meaning this test can be used) for the duration of the COVID-19 declaration under Section 564(b)(1) of the Act, 21 U.S.C. section 360bbb-3(b)(1), unless the authorization is terminated or revoked.  Performed at [facility], [address] [place], [place] [address]   Blood culture (routine x 2)     Status: None (Preliminary result)   Collection Time: 02/25/21  9:07 AM   Specimen: BLOOD  Result Value Ref Range Status   Specimen Description BLOOD RT FOREARM  Final   Special Requests   Final    BOTTLES DRAWN AEROBIC AND ANAEROBIC Blood Culture results may not be optimal due to an inadequate volume of blood received in culture bottles   Culture   Final    NO GROWTH 2 DAYS Performed at [facility], [address] [place], [place] [address]    Report Status PENDING  Incomplete  Blood culture (routine x 2)     Status: None (Preliminary result)   Collection Time: 02/25/21  9:15 AM   Specimen: BLOOD  Result Value Ref Range Status   Specimen Description BLOOD RIGHT [facility]  Final   Special Requests   Final    BOTTLES DRAWN AEROBIC AND ANAEROBIC Blood Culture adequate volume   Culture   Final    NO GROWTH 2 DAYS Performed at [facility], [address], [place], [place] [address]    Report Status PENDING  Incomplete   Studies/Results: No results found. Medications:  Scheduled Meds:  acetaminophen  650 mg Oral Q6H   calcium carbonate  1 tablet Oral Daily   celecoxib  200 mg Oral Daily   cholecalciferol  1,000 Units Oral Daily    enoxaparin (LOVENOX) injection  40 mg Subcutaneous Q24H   FLUoxetine  40 mg Oral Daily   multivitamin with minerals  1 tablet Oral Daily   Continuous Infusions: PRN Meds:.HYDROmorphone (DILAUDID) injection, morphine injection, ondansetron (ZOFRAN) IV   Assessment: Principal Problem:   Choledocholithiasis Active Problems:   Adjustment disorder with mixed anxiety and depressed mood   Transaminitis   Acute cholecystitis   GERD (gastroesophageal reflux disease)    Plan: Liver enzymes have improved since yesterday with total bilirubin at 1.9 compared to 3.5 yesterday  Discussed with patient that patient should follow-up in clinic after discharge to ensure liver enzymes normalize over the next few weeks  No clinical evidence of pancreatitis  GI service will sign off at this time, please page with any questions or concerns   LOS: 3 days   [name], MD 02/28/2021, 1:58 PM

## 2021-03-01 LAB — HEPATIC FUNCTION PANEL
ALT: 84 U/L — ABNORMAL HIGH (ref 0–44)
AST: 30 U/L (ref 15–41)
Albumin: 2.3 g/dL — ABNORMAL LOW (ref 3.5–5.0)
Alkaline Phosphatase: 184 U/L — ABNORMAL HIGH (ref 38–126)
Bilirubin, Direct: 0.4 mg/dL — ABNORMAL HIGH (ref 0.0–0.2)
Indirect Bilirubin: 0.7 mg/dL (ref 0.3–0.9)
Total Bilirubin: 1.1 mg/dL (ref 0.3–1.2)
Total Protein: 5.7 g/dL — ABNORMAL LOW (ref 6.5–8.1)

## 2021-03-01 LAB — GLUCOSE, CAPILLARY: Glucose-Capillary: 108 mg/dL — ABNORMAL HIGH (ref 70–99)

## 2021-03-01 MED ORDER — DOCUSATE SODIUM 100 MG PO CAPS: 100.0000 mg | ORAL_CAPSULE | Freq: Every day | ORAL | Status: DC | PRN

## 2021-03-01 NOTE — Plan of Care (Signed)
Continuing with plan of care. 

## 2021-03-01 NOTE — Plan of Care (Signed)
Discharge teaching completed with patient, including care and emptying of JP drain with return demonstration, patient is in stable condition.

## 2021-03-02 NOTE — Discharge Summary (Signed)
Physician Discharge Summary  Patient ID: Jamie Gordon MRN: [id number] DOB/AGE: May 14, 1976 45 y.o.  Admit date: 02/25/2021 Discharge date: 03/01/21  Admission Diagnoses: acute cholecystitis, elevated LFTs  Discharge Diagnoses:  Same as above, plus CBD stone  Discharged Condition: good  Hospital Course: admitted for above.  Underwent ERCP. See procedure note for details.  Recovered from that procedure and underwent robotic lap chole.  See op note for details.  Post op, recovered slowly but eventually tolerated diet, pain controlled, drain remains serosanguinous minimal output.  Ok to be d/c'd  Consults: GI  Discharge Exam: Blood pressure 116/78, pulse 93, temperature 97.8 F (36.6 C), temperature source Oral, resp. rate 17, height 5\' 4"  (1.626 m), weight 88.9 kg, last menstrual period 02/12/2021, SpO2 98 %. General appearance: alert, cooperative, and no distress GI: soft, no guarding, appropriate TTP around drain and incision sites, which are c/d/i  Disposition:  Discharge disposition: 01-Home or Self Care        Allergies as of 03/01/2021       Reactions   Latex Itching        Medication List     TAKE these medications    acetaminophen 325 MG tablet Commonly known as: Tylenol Take 2 tablets (650 mg total) by mouth every 8 (eight) hours as needed for mild pain.   calcium carbonate 1250 (500 Ca) MG tablet Commonly known as: OS-CAL - dosed in mg of elemental calcium Take 1 tablet by mouth daily.   cholecalciferol 25 MCG (1000 UNIT) tablet Commonly known as: VITAMIN D3 Take 1,000 Units by mouth daily.   docusate sodium 100 MG capsule Commonly known as: Colace Take 1 capsule (100 mg total) by mouth 2 (two) times daily as needed for up to 10 days for mild constipation.   esomeprazole 20 MG capsule Commonly known as: NEXIUM Take 20 mg by mouth daily as needed (acid reflux symptoms).   FLUoxetine 40 MG capsule Commonly known as: PROZAC Take 1 capsule (40 mg  total) by mouth daily. What changed: how much to take   FLUoxetine 40 MG capsule Commonly known as: PROZAC TAKE 1 CAPSULE BY MOUTH DAILY. What changed: You were already taking a medication with the same name, and this prescription was added. Make sure you understand how and when to take each.   HYDROcodone-acetaminophen 5-325 MG tablet Commonly known as: Norco Take 1 tablet by mouth every 6 (six) hours as needed for up to 6 doses for moderate pain.   ibuprofen 800 MG tablet Commonly known as: ADVIL Take 1 tablet (800 mg total) by mouth every 8 (eight) hours as needed for mild pain or moderate pain.   multivitamin with minerals tablet Take 1 tablet by mouth daily.        Follow-up Information     05/01/2021, Kristina Bertone, DO Follow up in 4 day(s).   Specialty: Surgery Why: post op chole and drain removal Contact information: [address] [place] [phone]                  Total time spent arranging discharge was >10min. Signed: 31m 03/02/2021, 12:42 PM

## 2021-03-03 ENCOUNTER — Encounter: Payer: Self-pay | Admitting: *Deleted

## 2021-03-03 ENCOUNTER — Other Ambulatory Visit: Payer: Self-pay | Admitting: *Deleted

## 2021-03-03 LAB — CULTURE, BLOOD (ROUTINE X 2)
Culture: NO GROWTH
Culture: NO GROWTH
Special Requests: ADEQUATE

## 2021-03-03 LAB — SURGICAL PATHOLOGY

## 2021-03-03 NOTE — Patient Outreach (Signed)
[facility]) Care Management  03/03/2021  Jamie Gordon 08/31/1976 [id number]   Transition of care call/case closure   Referral received:02/26/21 Initial outreach:03/03/21 Insurance: [employer]    Subjective: Initial successful telephone call to patient's preferred number in order to complete transition of care assessment; 2 HIPAA identifiers verified. Explained purpose of call and completed transition of care assessment.  Jamie Gordon states that she is doing better that she was, denies post-operative problems, says surgical incisions are unremarkable, she reports drain being in place, she continues to keep record of output, noting decrease in drainage. She anticipates drain removal at surgeon visit on 6/14. She  states surgical pain well managed with prescribed medications. She reports tolerating diet , bland diet, soft foods, she denies nausea . She reports having bowel movements and denies bladder problems .Spouse/ Mother are assisting with her  recovery.   Reviewed accessing the following [employer] Benefits : .  She states she does not have the hospital indemnity She  uses a [facility] outpatient pharmacy at [employer].     Objective:  Jamie Gordon was hospitalized at [facility] 6/7-6/11/22 for Acute cholecystitis , ERCP, Laparoscopic cholecystectomy , Comorbidities include: GERD  She was discharged to home on 03/01/21 without the need for home health services or DME.   Assessment:  Patient voices good understanding of all discharge instructions.  See transition of care flowsheet for assessment details.   Plan:  Reviewed hospital discharge diagnosis of Laparoscopic cholecystectomy  and discharge treatment plan using hospital discharge instructions, assessing medication adherence, reviewing problems requiring provider notification, and discussing the importance of follow up with surgeon, primary care provider and/or specialists  as directed.  Reviewed [employer] healthy lifestyle program information to receive discounted premium for  2023   Step 1: Get  your annual physical  Step 2: Complete your health assessment  Step 3:Identify your current health status and complete the corresponding action step between September 21, 2020 and May 22, 2021.     No ongoing care management needs identified so will close case to [facility] Care Management services and route successful outreach letter with [facility] Care Management pamphlet and 24 Hour Nurse Line Magnet to [employer] Care Management clinical pool to be mailed to patient's home address.  Thanked patient for their services to [facility].  Egbert Garibaldi, RN, BSN  [facility] Care Management,Care Management Coordinator  [phone]- Mobile [phone]- Toll Free Main Office

## 2021-03-04 ENCOUNTER — Telehealth: Payer: Self-pay

## 2021-03-04 NOTE — Telephone Encounter (Signed)
Pt called and states that she needs to go back to work on 03/18/21. Please advise for FMLA

## 2021-03-04 NOTE — Telephone Encounter (Signed)
I have changed paperwork & placed in Margaret's folder to sign.

## 2021-03-05 ENCOUNTER — Encounter: Payer: Self-pay | Admitting: Family

## 2021-03-12 NOTE — Anesthesia Postprocedure Evaluation (Signed)
Anesthesia Post Note  Patient: Jamie Gordon  Procedure(s) Performed: XI ROBOTIC ASSISTED LAPAROSCOPIC CHOLECYSTECTOMY  Patient location during evaluation: PACU Anesthesia Type: General Level of consciousness: awake and alert Pain management: pain level controlled Vital Signs Assessment: post-procedure vital signs reviewed and stable Respiratory status: spontaneous breathing, nonlabored ventilation, respiratory function stable and patient connected to nasal cannula oxygen Cardiovascular status: blood pressure returned to baseline and stable Postop Assessment: no apparent nausea or vomiting Anesthetic complications: no   No notable events documented.   Last Vitals:  Vitals:   03/01/21 0504 03/01/21 1133  BP: 106/66 116/78  Pulse: 91 93  Resp: 18 17  Temp: 36.4 C 36.6 C  SpO2: 97% 98%    Last Pain:  Vitals:   03/01/21 1133  TempSrc: Oral  PainSc:                  Yevette Edwards

## 2021-03-26 ENCOUNTER — Telehealth: Payer: Self-pay | Admitting: *Deleted

## 2021-03-26 NOTE — Telephone Encounter (Signed)
Jamie Spillers, MD  Jamie Gordon, Jamie Gordon  Please set up clinic appointment with me in 6-8 weeks for hospital follow upfor elevated liver enzymes     LVM to call our office for an appt.

## 2021-04-03 ENCOUNTER — Encounter: Payer: Self-pay | Admitting: Gastroenterology

## 2021-05-23 ENCOUNTER — Other Ambulatory Visit: Payer: Self-pay

## 2021-06-27 IMAGING — MR MR ABDOMEN WO/W CM MRCP
19 of 22 series · 44 of 48 positions shown · IV contrast (gadavist)
Comparison: Right upper quadrant ultrasound dated 02/25/2021

CLINICAL DATA: Abdominal pain, elevated LFTs, evaluate for biliary
obstruction

EXAM:
MRI ABDOMEN WITHOUT AND WITH CONTRAST (INCLUDING MRCP)
TECHNIQUE: Multiplanar multisequence MR imaging of the abdomen was performed
both before and after the administration of intravenous contrast.
Heavily T2-weighted images of the biliary and pancreatic ducts were
obtained, and three-dimensional MRCP images were rendered by post
processing.
CONTRAST:  7.5mL GADAVIST GADOBUTROL 1 MMOL/ML IV SOLN

[Series 3: T2 · coronal · 6.0mm · 1.19mm/px · 1 of 32 slices shown (1 of 2)]
[im 1/32]
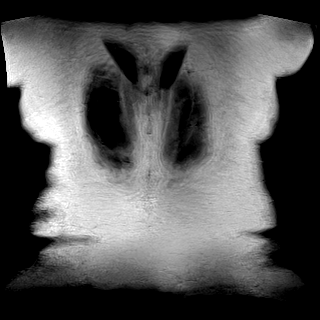

[Series 4: T2 · axial · 6.0mm · 1.19mm/px · 1 of 34 slices shown (2 of 2)]
[im 1/34]
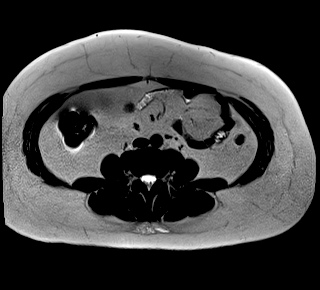

[Series 5: in & out · axial · 3.0mm · 1.19mm/px · z∈[-88,+173]mm · 2 of 88 slices shown (1 of 2)]
[im 1/88]
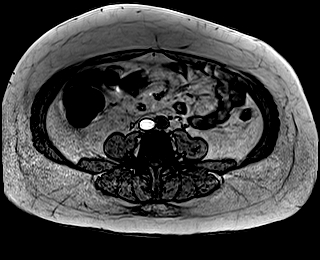
[im 88/88]
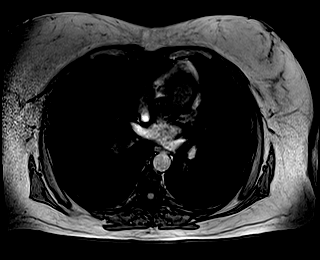

[Series 6: in & out · axial · 3.0mm · 1.19mm/px · z∈[-88,+173]mm · 2 of 88 slices shown (2 of 2)]
[im 1/88]
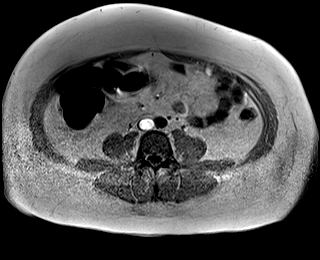
[im 88/88]
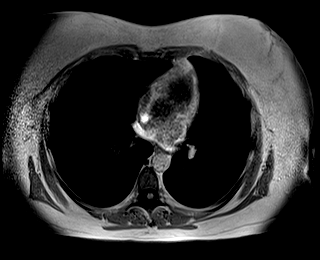

[Series 8: ax dwi_tracew · axial · 6.0mm · 1.42mm/px · z∈[-79,+159]mm · 4 of 102 slices shown]
[im 1/102]
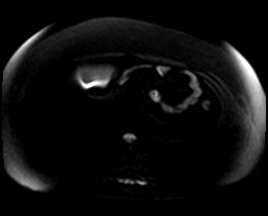
[im 34/102]
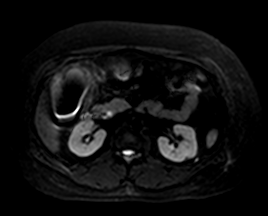
[im 68/102]
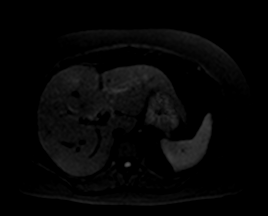
[im 102/102]
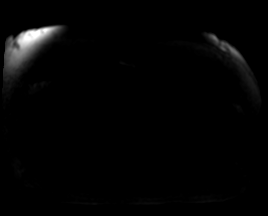

[Series 9: ax dwi_adc · axial · 6.0mm · 1.42mm/px · 1 of 34 slices shown]
[im 1/34]
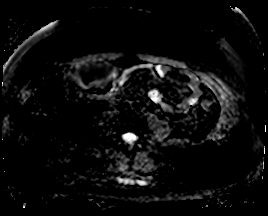

[Series 18: T2 fat-sat · axial · 6.0mm · 1.19mm/px · 1 of 34 slices shown]
[im 1/34]
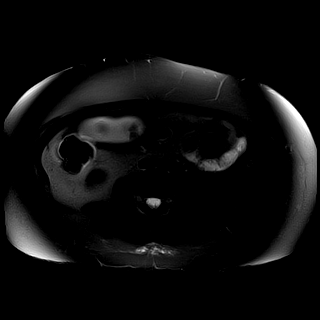

[Series 19: MRCP · coronal · 3.0mm · 1.12mm/px · 1 of 17 slices shown]
[im 1/17]
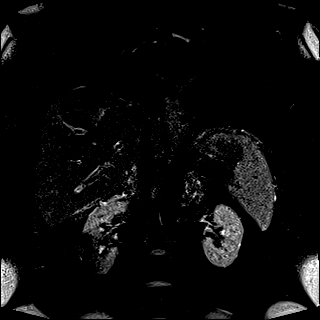

[Series 20: radials · coronal · 50.0mm · 0.78mm/px · 1 of 5 slices shown]
[im 1/5]
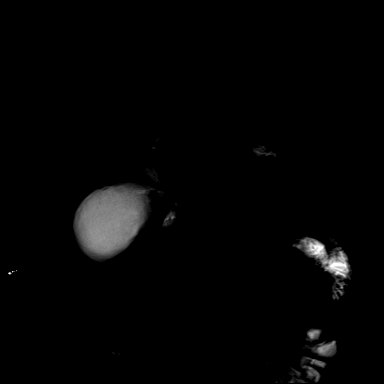

[Series 21: T1 dynamic fat-sat · axial · non-contrast · 3.2mm · 1.19mm/px · z∈[-61,+166]mm · 3 of 72 slices shown (1 of 5)]
[im 1/72]
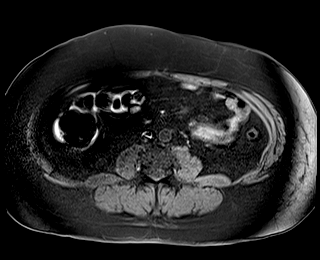
[im 36/72]
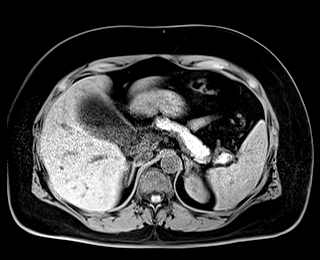
[im 72/72]
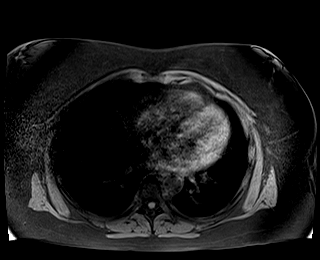

[Series 22: T1 dynamic fat-sat post-contrast · axial · 3.2mm · 1.19mm/px · z∈[-61,+166]mm · 3 of 72 slices shown (1 of 4)]
[im 1/72]
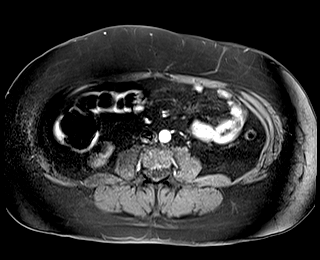
[im 36/72]
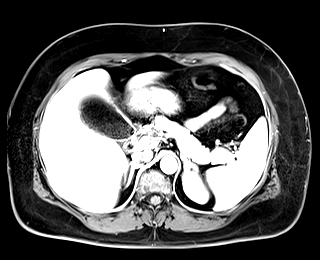
[im 72/72]
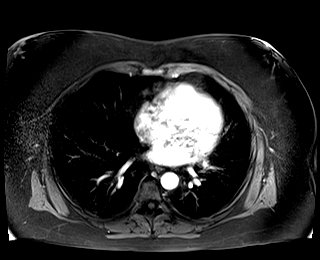

[Series 23: T1 dynamic fat-sat · axial · 3.2mm · 1.19mm/px · z∈[-61,+166]mm · 3 of 72 slices shown (2 of 5)]
[im 1/72]
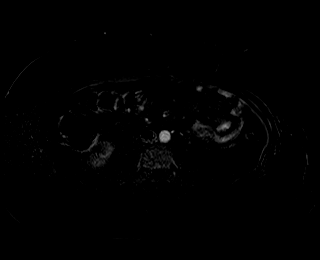
[im 36/72]
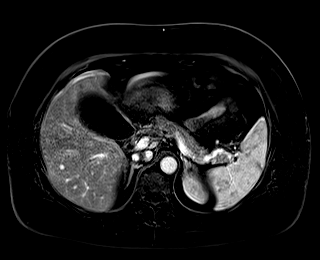
[im 72/72]
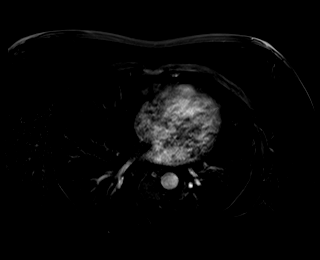

[Series 24: T1 dynamic fat-sat post-contrast · axial · 3.2mm · 1.19mm/px · z∈[-61,+166]mm · 3 of 72 slices shown (2 of 4)]
[im 1/72]
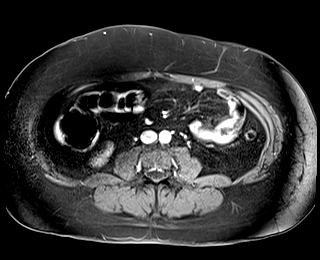
[im 36/72]
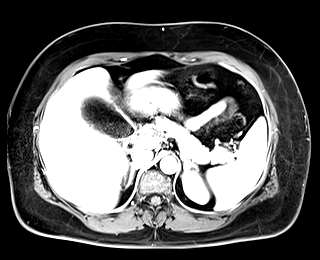
[im 72/72]
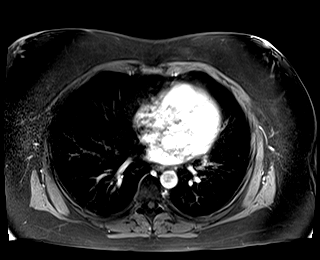

[Series 25: T1 dynamic fat-sat · axial · 3.2mm · 1.19mm/px · z∈[-61,+166]mm · 3 of 72 slices shown (3 of 5)]
[im 1/72]
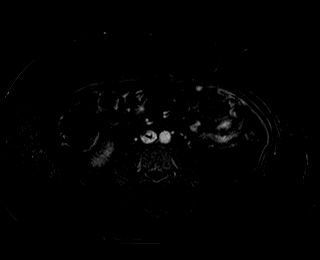
[im 36/72]
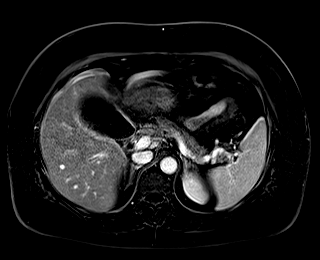
[im 72/72]
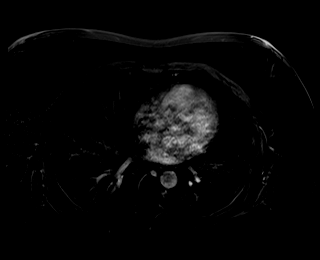

[Series 26: T1 dynamic fat-sat post-contrast · axial · 3.2mm · 1.19mm/px · z∈[-61,+166]mm · 3 of 72 slices shown (3 of 4)]
[im 1/72]
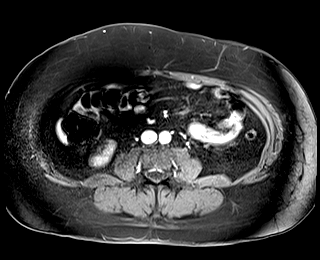
[im 36/72]
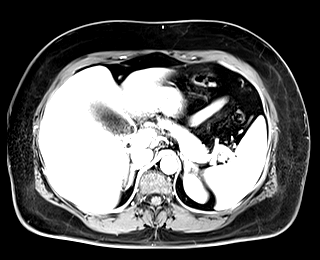
[im 72/72]
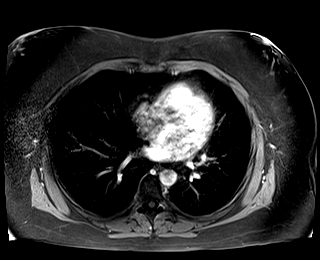

[Series 27: T1 dynamic fat-sat · axial · 3.2mm · 1.19mm/px · z∈[-61,+166]mm · 3 of 72 slices shown (4 of 5)]
[im 1/72]
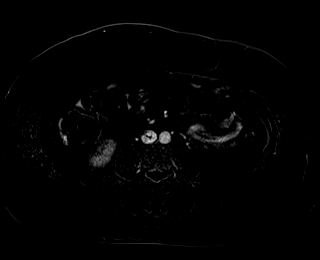
[im 36/72]
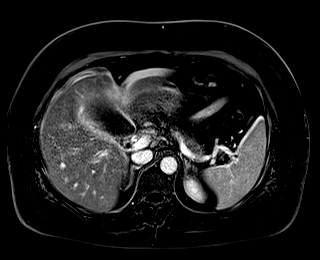
[im 72/72]
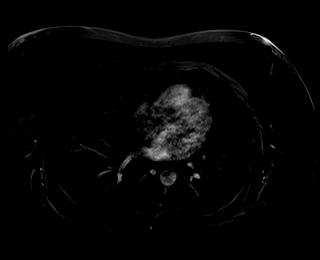

[Series 28: T1 dynamic post-contrast · coronal · 3.0mm · 1.31mm/px · 3 of 72 slices shown]
[im 1/72]
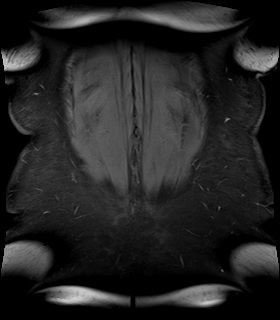
[im 36/72]
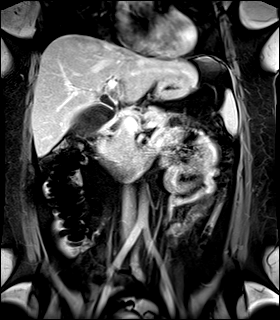
[im 72/72]
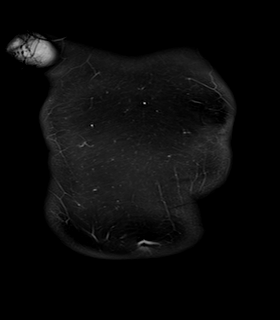

[Series 29: T1 dynamic fat-sat post-contrast · axial · 3.2mm · 1.19mm/px · z∈[-61,+166]mm · 3 of 72 slices shown (4 of 4)]
[im 1/72]
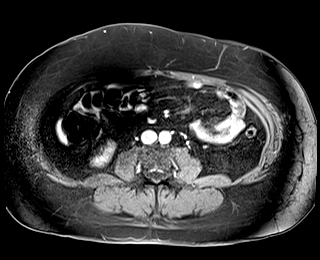
[im 36/72]
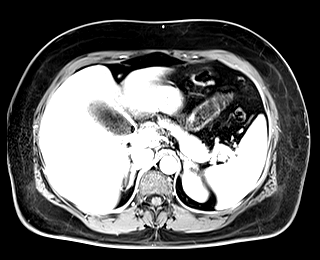
[im 72/72]
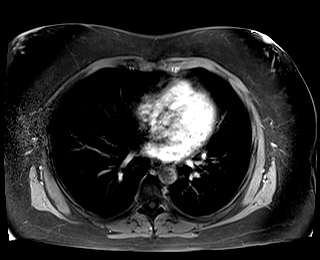

[Series 30: T1 dynamic fat-sat · axial · 3.2mm · 1.19mm/px · z∈[-61,+166]mm · 3 of 72 slices shown (5 of 5)]
[im 1/72]
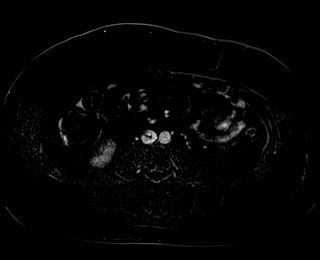
[im 36/72]
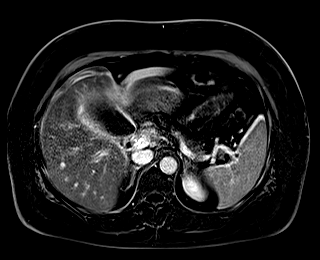
[im 72/72]
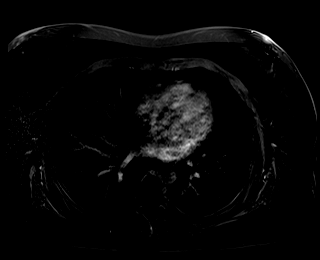

[44 of 48 positions shown; findings below may reference images not displayed]

FINDINGS: Lower chest: Lung bases are clear.

Hepatobiliary: No morphologic findings of cirrhosis. No hepatic
steatosis. 15 mm cyst in segment 4A (series 4/image 11), benign.

Distended gallbladder with very mild irregular gallbladder wall
thickening, layering gallbladder sludge, and mild pericholecystic
inflammatory changes (series 4/image 23).

Dilated common duct, measuring 10 mm. Mild gallbladder sludge in the
distal CBD (series 4/image 23). Although poorly visualized, there is
a suspected 3 mm distal CBD stone at the ampulla (series 19/image
7).

Pancreas:  Within normal limits.

Spleen:  Within normal limits.

Adrenals/Urinary Tract:  Adrenal glands are within normal limits.

Kidneys are within normal limits.  No hydronephrosis.

Stomach/Bowel: Stomach is notable for a tiny hiatal hernia.

Visualized bowel is grossly unremarkable.

Vascular/Lymphatic:  No evidence of abdominal aortic aneurysm.

No suspicious abdominal lymphadenopathy.

Other:  No abdominal ascites.

Musculoskeletal: No focal osseous lesions.
IMPRESSION: Distended gallbladder with layering gallbladder sludge and mild
pericholecystic inflammatory changes. This raises concern for very
early acute cholecystitis.

Dilated common duct, measuring 10 mm, with mild sludge and suspected
3 mm distal CBD stone at the ampulla. If surgical intervention is
not performed, consider ERCP.

## 2021-08-25 ENCOUNTER — Other Ambulatory Visit: Payer: Self-pay

## 2021-11-07 ENCOUNTER — Other Ambulatory Visit: Payer: Self-pay

## 2021-11-14 ENCOUNTER — Encounter: Payer: 59 | Admitting: Family

## 2021-11-21 ENCOUNTER — Encounter: Payer: 59 | Admitting: Family

## 2022-01-01 ENCOUNTER — Encounter: Payer: Self-pay | Admitting: Family

## 2022-01-02 ENCOUNTER — Other Ambulatory Visit: Payer: Self-pay

## 2022-01-02 DIAGNOSIS — F4323 Adjustment disorder with mixed anxiety and depressed mood: Secondary | ICD-10-CM

## 2022-01-02 MED ORDER — FLUOXETINE HCL 40 MG PO CAPS
40.0000 mg | ORAL_CAPSULE | Freq: Every day | ORAL | 1 refills | Status: DC
  Filled 2022-01-02 – 2022-01-30 (×3): qty 30, 30d supply, fill #0

## 2022-01-12 ENCOUNTER — Other Ambulatory Visit: Payer: Self-pay

## 2022-01-30 ENCOUNTER — Other Ambulatory Visit: Payer: Self-pay

## 2022-02-05 ENCOUNTER — Other Ambulatory Visit: Payer: Self-pay

## 2022-02-19 ENCOUNTER — Encounter: Payer: Self-pay | Admitting: Family

## 2022-02-25 ENCOUNTER — Other Ambulatory Visit: Payer: Self-pay

## 2022-02-25 DIAGNOSIS — F4323 Adjustment disorder with mixed anxiety and depressed mood: Secondary | ICD-10-CM

## 2022-02-25 MED ORDER — FLUOXETINE HCL 40 MG PO CAPS
40.0000 mg | ORAL_CAPSULE | Freq: Every day | ORAL | 3 refills | Status: DC
  Filled 2022-02-25: qty 90, 90d supply, fill #0
  Filled 2022-06-04: qty 90, 90d supply, fill #1
  Filled 2022-09-02: qty 90, 90d supply, fill #2
  Filled 2022-11-24: qty 90, 90d supply, fill #3

## 2022-02-27 ENCOUNTER — Other Ambulatory Visit: Payer: Self-pay

## 2022-05-18 ENCOUNTER — Ambulatory Visit (INDEPENDENT_AMBULATORY_CARE_PROVIDER_SITE_OTHER): Payer: 59 | Admitting: Family

## 2022-05-18 ENCOUNTER — Encounter: Payer: Self-pay | Admitting: Family

## 2022-05-18 VITALS — BP 122/80 | HR 66 | Temp 98.2°F | Ht 64.0 in | Wt 217.8 lb

## 2022-05-18 DIAGNOSIS — Z23 Encounter for immunization: Secondary | ICD-10-CM

## 2022-05-18 DIAGNOSIS — N946 Dysmenorrhea, unspecified: Secondary | ICD-10-CM | POA: Diagnosis not present

## 2022-05-18 DIAGNOSIS — Z0000 Encounter for general adult medical examination without abnormal findings: Secondary | ICD-10-CM | POA: Diagnosis not present

## 2022-05-18 LAB — COMPREHENSIVE METABOLIC PANEL
ALT: 15 U/L (ref 0–35)
AST: 16 U/L (ref 0–37)
Albumin: 3.9 g/dL (ref 3.5–5.2)
Alkaline Phosphatase: 111 U/L (ref 39–117)
BUN: 12 mg/dL (ref 6–23)
CO2: 24 mEq/L (ref 19–32)
Calcium: 8.6 mg/dL (ref 8.4–10.5)
Chloride: 104 mEq/L (ref 96–112)
Creatinine, Ser: 0.81 mg/dL (ref 0.40–1.20)
GFR: 88.29 mL/min (ref >60.00)
Glucose, Bld: 92 mg/dL (ref 70–99)
Potassium: 3.8 mEq/L (ref 3.5–5.1)
Sodium: 138 mEq/L (ref 135–145)
Total Bilirubin: 0.4 mg/dL (ref 0.2–1.2)
Total Protein: 6.2 g/dL (ref 6.0–8.3)

## 2022-05-18 LAB — CBC WITH DIFFERENTIAL/PLATELET
Basophils Absolute: 0.1 10*3/uL (ref 0.0–0.1)
Basophils Relative: 0.8 % (ref 0.0–3.0)
Eosinophils Absolute: 0.2 10*3/uL (ref 0.0–0.7)
Eosinophils Relative: 2.1 % (ref 0.0–5.0)
HCT: 39.4 % (ref 36.0–46.0)
Hemoglobin: 13.2 g/dL (ref 12.0–15.0)
Lymphocytes Relative: 21.3 % (ref 12.0–46.0)
Lymphs Abs: 1.7 10*3/uL (ref 0.7–4.0)
MCHC: 33.6 g/dL (ref 30.0–36.0)
MCV: 84.6 fl (ref 78.0–100.0)
Monocytes Absolute: 0.6 10*3/uL (ref 0.1–1.0)
Monocytes Relative: 7.4 % (ref 3.0–12.0)
Neutro Abs: 5.3 10*3/uL (ref 1.4–7.7)
Neutrophils Relative %: 68.4 % (ref 43.0–77.0)
Platelets: 252 10*3/uL (ref 150.0–400.0)
RBC: 4.65 Mil/uL (ref 3.87–5.11)
RDW: 13.1 % (ref 11.5–15.5)
WBC: 7.8 10*3/uL (ref 4.0–10.5)

## 2022-05-18 LAB — FOLLICLE STIMULATING HORMONE: FSH: 3.7 m[IU]/mL

## 2022-05-18 LAB — HEMOGLOBIN A1C: Hgb A1c MFr Bld: 5.6 % (ref 4.6–6.5)

## 2022-05-18 LAB — LIPID PANEL
Cholesterol: 200 mg/dL (ref 0–200)
HDL: 42.3 mg/dL (ref >39.00)
LDL Cholesterol: 137 mg/dL — ABNORMAL HIGH (ref 0–99)
NonHDL: 157.99
Total CHOL/HDL Ratio: 5
Triglycerides: 103 mg/dL (ref 0.0–149.0)
VLDL: 20.6 mg/dL (ref 0.0–40.0)

## 2022-05-18 LAB — TSH: TSH: 2.54 u[IU]/mL (ref 0.35–5.50)

## 2022-05-18 LAB — VITAMIN D 25 HYDROXY (VIT D DEFICIENCY, FRACTURES): VITD: 40.39 ng/mL (ref 30.00–100.00)

## 2022-05-18 NOTE — Progress Notes (Signed)
Subjective:    Patient ID: Jamie Gordon, female    DOB: Feb 17, 1976, 46 y.o.   MRN: [id number]  CC: Jamie Gordon is a 46 y.o. female who presents today for physical exam.    HPI: She describes irregular menses. She missed menses a couple of months ago.  She hasnt a menses this month. She heavy menses 2 months ago. 'normal' cramping.  No changes in vaginal discharge  She is sexually active.    No H/o PCOS, fibroids.      History of ectopic pregnancy, endometriosis, miscarriage  Colorectal Cancer Screening: no early family history Breast Cancer Screening: Mammogram due Cervical Cancer Screening: UTD, 11/08/2020 negative HPV negative malignancy         Tetanus - due         Labs: Screening labs today. Exercise: No regular exercise.   Alcohol use:  none Smoking/tobacco use: Nonsmoker.    No new skin lesions HISTORY:  Past Medical History:  Diagnosis Date   Ectopic pregnancy    s/p left partial salpingectomy   Endometriosis    Female fertility problem    Habitual abortion history, antepartum    Miscarriage    x2 at 5 weeks    Past Surgical History:  Procedure Laterality Date   ECTOPIC PREGNANCY SURGERY  02/2013   Dr. Greggory Keen   ERCP N/A 02/25/2021   Procedure: ENDOSCOPIC RETROGRADE CHOLANGIOPANCREATOGRAPHY (ERCP);  Surgeon: Midge Minium, MD;  Location: [facility] ENDOSCOPY;  Service: Endoscopy;  Laterality: N/A;   WISDOM TOOTH EXTRACTION     Family History  Problem Relation Age of Onset   Hyperlipidemia Mother    Cancer Maternal Grandmother        ovarian   Heart disease Maternal Grandmother    Arthritis Father    Hypertension Father    Ovarian cancer Maternal Grandfather    Diabetes Neg Hx    Colon cancer Neg Hx    Breast cancer Neg Hx       ALLERGIES: Latex  Current Outpatient Medications on File Prior to Visit  Medication Sig Dispense Refill   calcium carbonate (OS-CAL - DOSED IN MG OF ELEMENTAL CALCIUM) 1250 (500 Ca) MG tablet Take 1 tablet by  mouth daily.     cholecalciferol (VITAMIN D3) 25 MCG (1000 UNIT) tablet Take 1,000 Units by mouth daily.     esomeprazole (NEXIUM) 20 MG capsule Take 20 mg by mouth daily as needed (acid reflux symptoms).     FLUoxetine (PROZAC) 40 MG capsule Take 1 capsule (40 mg total) by mouth daily. 90 capsule 3   HYDROcodone-acetaminophen (NORCO) 5-325 MG tablet Take 1 tablet by mouth every 6 (six) hours as needed for up to 6 doses for moderate pain. 6 tablet 0   ibuprofen (ADVIL) 800 MG tablet Take 1 tablet (800 mg total) by mouth every 8 (eight) hours as needed for mild pain or moderate pain. 30 tablet 0   Multiple Vitamins-Minerals (MULTIVITAMIN WITH MINERALS) tablet Take 1 tablet by mouth daily.     FLUoxetine (PROZAC) 40 MG capsule TAKE 1 CAPSULE BY MOUTH DAILY. 90 capsule 3   No current facility-administered medications on file prior to visit.    Social History   Tobacco Use   Smoking status: Never   Smokeless tobacco: Never  Vaping Use   Vaping Use: Never used  Substance Use Topics   Alcohol use: No   Drug use: No    Review of Systems  Constitutional:  Negative for chills, fever and unexpected weight  change.  HENT:  Negative for congestion.   Respiratory:  Negative for cough.   Cardiovascular:  Negative for chest pain, palpitations and leg swelling.  Gastrointestinal:  Negative for nausea and vomiting.  Genitourinary:  Positive for menstrual problem and vaginal bleeding. Negative for pelvic pain.  Musculoskeletal:  Negative for arthralgias and myalgias.  Skin:  Negative for rash.  Neurological:  Negative for headaches.  Hematological:  Negative for adenopathy.  Psychiatric/Behavioral:  Negative for confusion.       Objective:    BP 122/80   Pulse 66   Temp 98.2 F (36.8 C) (Oral)   Ht 5\' 4"  (1.626 m)   Wt 217 lb 12.8 oz (98.8 kg)   LMP 04/09/2022 (Approximate)   SpO2 98%   BMI 37.39 kg/m   BP Readings from Last 3 Encounters:  05/18/22 122/80  03/01/21 116/78  11/08/20  120/78   Wt Readings from Last 3 Encounters:  05/18/22 217 lb 12.8 oz (98.8 kg)  02/27/21 195 lb 15.8 oz (88.9 kg)  11/08/20 210 lb 3.2 oz (95.3 kg)    Physical Exam Vitals reviewed.  Constitutional:      Appearance: Normal appearance. She is well-developed.  Eyes:     Conjunctiva/sclera: Conjunctivae normal.  Neck:     Thyroid: No thyroid mass or thyromegaly.  Cardiovascular:     Rate and Rhythm: Normal rate and regular rhythm.     Pulses: Normal pulses.     Heart sounds: Normal heart sounds.  Pulmonary:     Effort: Pulmonary effort is normal.     Breath sounds: Normal breath sounds. No wheezing, rhonchi or rales.  Chest:  Breasts:    Breasts are symmetrical.     Right: No inverted nipple, mass, nipple discharge, skin change or tenderness.     Left: No inverted nipple, mass, nipple discharge, skin change or tenderness.  Abdominal:     General: Bowel sounds are normal. There is no distension.     Palpations: Abdomen is soft. Abdomen is not rigid. There is no fluid wave or mass.     Tenderness: There is no abdominal tenderness. There is no guarding or rebound.  Lymphadenopathy:     Head:     Right side of head: No submental, submandibular, tonsillar, preauricular, posterior auricular or occipital adenopathy.     Left side of head: No submental, submandibular, tonsillar, preauricular, posterior auricular or occipital adenopathy.     Cervical: No cervical adenopathy.     Right cervical: No superficial, deep or posterior cervical adenopathy.    Left cervical: No superficial, deep or posterior cervical adenopathy.  Skin:    General: Skin is warm and dry.     Comments: Hair growth around umbilicus  Neurological:     Mental Status: She is alert.  Psychiatric:        Speech: Speech normal.        Behavior: Behavior normal.        Thought Content: Thought content normal.        Assessment & Plan:   Problem List Items Addressed This Visit       Genitourinary    Dysmenorrhea    Differential includes PCOS, early menopause, primary ovarian insufficiency.  She would likely require endometrial sampling based on age 88 , obesity. Pending labs, pelvic 61, referral to GYN.       Relevant Orders   Hemoglobin A1c   TSH   hCG, serum, qualitative   Prolactin   FSH   US  PELVIC COMPLETE WITH TRANSVAGINAL   Ambulatory referral to Obstetrics / Gynecology     Other   Routine general medical examination at a health care facility - Primary    Clinical breast exam performed today.  Patient will schedule mammogram.  Encouraged walking program. Pap is UTD and patient politely declined pelvic exam today.       Relevant Orders   CBC with Differential/Platelet   Comprehensive metabolic panel   Lipid panel   VITAMIN D 25 Hydroxy (Vit-D Deficiency, Fractures)   MM 3D SCREEN BREAST BILATERAL   Other Visit Diagnoses     Need for tetanus, diphtheria, and acellular pertussis (Tdap) vaccine       Relevant Orders   Tdap vaccine greater than or equal to 7yo IM (Completed)        I am having Zuley M. Moynahan maintain her multivitamin with minerals, cholecalciferol, calcium carbonate, esomeprazole, ibuprofen, HYDROcodone-acetaminophen, FLUoxetine, and FLUoxetine.   No orders of the defined types were placed in this encounter.   Return precautions given.   Risks, benefits, and alternatives of the medications and treatment plan prescribed today were discussed, and patient expressed understanding.   Education regarding symptom management and diagnosis given to patient on AVS.   Continue to follow with Allegra Grana, FNP for routine health maintenance.   Aeon M Alleva and I agreed with plan.   Rennie Plowman, FNP

## 2022-05-18 NOTE — Patient Instructions (Signed)
I have ordered transvaginal ultrasound and placed a referral to GYN  Let us know if you dont hear back within a week in regards to an appointment being scheduled.    Please let me know when you are 46 years old and I can order colonoscopy.   Please let me know if you would like referral to dermatology for annual skin exam.   Health Maintenance, Female Adopting a healthy lifestyle and getting preventive care are important in promoting health and wellness. Ask your health care provider about: The right schedule for you to have regular tests and exams. Things you can do on your own to prevent diseases and keep yourself healthy. What should I know about diet, weight, and exercise? Eat a healthy diet  Eat a diet that includes plenty of vegetables, fruits, low-fat dairy products, and lean protein. Do not eat a lot of foods that are high in solid fats, added sugars, or sodium. Maintain a healthy weight Body mass index (BMI) is used to identify weight problems. It estimates body fat based on height and weight. Your health care provider can help determine your BMI and help you achieve or maintain a healthy weight. Get regular exercise Get regular exercise. This is one of the most important things you can do for your health. Most adults should: Exercise for at least 150 minutes each week. The exercise should increase your heart rate and make you sweat (moderate-intensity exercise). Do strengthening exercises at least twice a week. This is in addition to the moderate-intensity exercise. Spend less time sitting. Even light physical activity can be beneficial. Watch cholesterol and blood lipids Have your blood tested for lipids and cholesterol at 44 years of age, then have this test every 5 years. Have your cholesterol levels checked more often if: Your lipid or cholesterol levels are high. You are older than 44 years of age. You are at high risk for heart disease. What should I know about cancer  screening? Depending on your health history and family history, you may need to have cancer screening at various ages. This may include screening for: Breast cancer. Cervical cancer. Colorectal cancer. Skin cancer. Lung cancer. What should I know about heart disease, diabetes, and high blood pressure? Blood pressure and heart disease High blood pressure causes heart disease and increases the risk of stroke. This is more likely to develop in people who have high blood pressure readings or are overweight. Have your blood pressure checked: Every 3-5 years if you are 33-25 years of age. Every year if you are 34 years old or older. Diabetes Have regular diabetes screenings. This checks your fasting blood sugar level. Have the screening done: Once every three years after age 93 if you are at a normal weight and have a low risk for diabetes. More often and at a younger age if you are overweight or have a high risk for diabetes. What should I know about preventing infection? Hepatitis B If you have a higher risk for hepatitis B, you should be screened for this virus. Talk with your health care provider to find out if you are at risk for hepatitis B infection. Hepatitis C Testing is recommended for: Everyone born from [birth year] through 1963. Anyone with known risk factors for hepatitis C. Sexually transmitted infections (STIs) Get screened for STIs, including gonorrhea and chlamydia, if: You are sexually active and are younger than 44 years of age. You are older than 44 years of age and your health care provider tells you  that you are at risk for this type of infection. Your sexual activity has changed since you were last screened, and you are at increased risk for chlamydia or gonorrhea. Ask your health care provider if you are at risk. Ask your health care provider about whether you are at high risk for HIV. Your health care provider may recommend a prescription medicine to help prevent HIV  infection. If you choose to take medicine to prevent HIV, you should first get tested for HIV. You should then be tested every 3 months for as long as you are taking the medicine. Pregnancy If you are about to stop having your period (premenopausal) and you may become pregnant, seek counseling before you get pregnant. Take 400 to 800 micrograms (mcg) of folic acid every day if you become pregnant. Ask for birth control (contraception) if you want to prevent pregnancy. Osteoporosis and menopause Osteoporosis is a disease in which the bones lose minerals and strength with aging. This can result in bone fractures. If you are 31 years old or older, or if you are at risk for osteoporosis and fractures, ask your health care provider if you should: Be screened for bone loss. Take a calcium or vitamin D supplement to lower your risk of fractures. Be given hormone replacement therapy (HRT) to treat symptoms of menopause. Follow these instructions at home: Alcohol use Do not drink alcohol if: Your health care provider tells you not to drink. You are pregnant, may be pregnant, or are planning to become pregnant. If you drink alcohol: Limit how much you have to: 0-1 drink a day. Know how much alcohol is in your drink. In the [place] one drink equals one 12 oz bottle of beer (355 mL), one 5 oz glass of wine (148 mL), or one 1 oz glass of hard liquor (44 mL). Lifestyle Do not use any products that contain nicotine or tobacco. These products include cigarettes, chewing tobacco, and vaping devices, such as e-cigarettes. If you need help quitting, ask your health care provider. Do not use street drugs. Do not share needles. Ask your health care provider for help if you need support or information about quitting drugs. General instructions Schedule regular health, dental, and eye exams. Stay current with your vaccines. Tell your health care provider if: You often feel depressed. You have ever been abused  or do not feel safe at home. Summary Adopting a healthy lifestyle and getting preventive care are important in promoting health and wellness. Follow your health care provider's instructions about healthy diet, exercising, and getting tested or screened for diseases. Follow your health care provider's instructions on monitoring your cholesterol and blood pressure. This information is not intended to replace advice given to you by your health care provider. Make sure you discuss any questions you have with your health care provider. Document Revised: 01/27/2021 Document Reviewed: 01/27/2021 Elsevier Patient Education  [place].

## 2022-05-18 NOTE — Assessment & Plan Note (Signed)
Clinical breast exam performed today.  Patient will schedule mammogram.  Encouraged walking program. Pap is UTD and patient politely declined pelvic exam today.

## 2022-05-18 NOTE — Assessment & Plan Note (Addendum)
Differential includes PCOS, early menopause, primary ovarian insufficiency.  She would likely require endometrial sampling based on age 46 , obesity. Pending labs, pelvic US, referral to GYN.

## 2022-05-18 NOTE — Progress Notes (Signed)
Having irregular periods and Wants tetanus vaccine?

## 2022-05-19 ENCOUNTER — Encounter: Payer: Self-pay | Admitting: Family

## 2022-05-19 LAB — HCG, SERUM, QUALITATIVE: Preg, Serum: NEGATIVE

## 2022-05-19 LAB — PROLACTIN: Prolactin: 11.9 ng/mL

## 2022-06-04 ENCOUNTER — Other Ambulatory Visit: Payer: Self-pay

## 2022-06-05 ENCOUNTER — Ambulatory Visit
Admission: RE | Admit: 2022-06-05 | Discharge: 2022-06-05 | Disposition: A | Discharge status: Home / Self Care | Payer: 59 | Source: Ambulatory Visit | Attending: Family | Admitting: Family

## 2022-06-05 DIAGNOSIS — N946 Dysmenorrhea, unspecified: Secondary | ICD-10-CM | POA: Insufficient documentation

## 2022-06-17 ENCOUNTER — Ambulatory Visit: Payer: 59 | Admitting: Family

## 2022-06-18 ENCOUNTER — Ambulatory Visit
Admission: RE | Admit: 2022-06-18 | Discharge: 2022-06-18 | Disposition: A | Discharge status: Home / Self Care | Payer: 59 | Source: Ambulatory Visit | Attending: Family | Admitting: Family

## 2022-06-18 DIAGNOSIS — Z1231 Encounter for screening mammogram for malignant neoplasm of breast: Secondary | ICD-10-CM | POA: Insufficient documentation

## 2022-06-18 DIAGNOSIS — Z0000 Encounter for general adult medical examination without abnormal findings: Secondary | ICD-10-CM

## 2022-09-02 ENCOUNTER — Other Ambulatory Visit (HOSPITAL_COMMUNITY): Payer: Self-pay

## 2022-09-02 ENCOUNTER — Encounter (HOSPITAL_COMMUNITY): Payer: Self-pay

## 2022-11-20 ENCOUNTER — Encounter: Payer: Self-pay | Admitting: Family

## 2022-11-20 ENCOUNTER — Other Ambulatory Visit: Payer: Self-pay | Admitting: Family

## 2022-11-20 ENCOUNTER — Other Ambulatory Visit: Payer: Self-pay

## 2022-11-20 DIAGNOSIS — T7840XA Allergy, unspecified, initial encounter: Secondary | ICD-10-CM

## 2022-11-20 MED ORDER — FEXOFENADINE-PSEUDOEPHED ER 180-240 MG PO TB24
1.0000 | ORAL_TABLET | Freq: Every day | ORAL | 2 refills | Status: DC
  Filled 2022-11-20 (×2): qty 30, 30d supply, fill #0
  Filled 2022-12-19: qty 30, 30d supply, fill #1
  Filled 2023-01-18: qty 30, 30d supply, fill #2

## 2022-12-21 ENCOUNTER — Other Ambulatory Visit: Payer: Self-pay

## 2022-12-22 ENCOUNTER — Other Ambulatory Visit: Payer: Self-pay

## 2023-01-18 ENCOUNTER — Other Ambulatory Visit: Payer: Self-pay

## 2023-02-22 ENCOUNTER — Other Ambulatory Visit: Payer: Self-pay | Admitting: Family

## 2023-02-22 ENCOUNTER — Other Ambulatory Visit: Payer: Self-pay

## 2023-02-22 DIAGNOSIS — F4323 Adjustment disorder with mixed anxiety and depressed mood: Secondary | ICD-10-CM

## 2023-02-22 DIAGNOSIS — T7840XA Allergy, unspecified, initial encounter: Secondary | ICD-10-CM

## 2023-02-22 MED FILL — Fexofenadine-Pseudoephedrine Tab ER 24HR 180-240 MG: ORAL | 30 days supply | Qty: 30 | Fill #0 | Status: AC

## 2023-02-22 MED FILL — Fluoxetine HCl Cap 40 MG: ORAL | 90 days supply | Qty: 90 | Fill #0 | Status: AC

## 2023-02-23 ENCOUNTER — Other Ambulatory Visit: Payer: Self-pay

## 2023-04-04 ENCOUNTER — Other Ambulatory Visit: Payer: Self-pay

## 2023-04-04 MED FILL — Fexofenadine-Pseudoephedrine Tab ER 24HR 180-240 MG: ORAL | 30 days supply | Qty: 30 | Fill #1 | Status: CN

## 2023-04-05 ENCOUNTER — Other Ambulatory Visit: Payer: Self-pay

## 2023-04-05 MED FILL — Fexofenadine-Pseudoephedrine Tab ER 24HR 180-240 MG: ORAL | 20 days supply | Qty: 20 | Fill #1 | Status: AC

## 2023-04-17 MED FILL — Fexofenadine-Pseudoephedrine Tab ER 24HR 180-240 MG: ORAL | 10 days supply | Qty: 10 | Fill #2 | Status: CN

## 2023-04-18 ENCOUNTER — Other Ambulatory Visit: Payer: Self-pay

## 2023-04-19 ENCOUNTER — Other Ambulatory Visit: Payer: Self-pay

## 2023-04-19 MED FILL — Fexofenadine-Pseudoephedrine Tab ER 24HR 180-240 MG: ORAL | 30 days supply | Qty: 30 | Fill #2 | Status: AC

## 2023-05-06 ENCOUNTER — Encounter (INDEPENDENT_AMBULATORY_CARE_PROVIDER_SITE_OTHER): Payer: Self-pay

## 2023-05-21 ENCOUNTER — Encounter: Payer: Commercial Managed Care - PPO | Admitting: Family

## 2023-05-24 MED FILL — Fexofenadine-Pseudoephedrine Tab ER 24HR 180-240 MG: ORAL | 30 days supply | Qty: 30 | Fill #3 | Status: CN

## 2023-05-25 ENCOUNTER — Other Ambulatory Visit: Payer: Self-pay | Admitting: Family

## 2023-05-25 ENCOUNTER — Other Ambulatory Visit: Payer: Self-pay

## 2023-05-25 DIAGNOSIS — T7840XA Allergy, unspecified, initial encounter: Secondary | ICD-10-CM

## 2023-05-25 MED FILL — Fexofenadine-Pseudoephedrine Tab ER 24HR 180-240 MG: ORAL | 30 days supply | Qty: 30 | Fill #0 | Status: AC

## 2023-05-25 MED FILL — Fluoxetine HCl Cap 40 MG: ORAL | 90 days supply | Qty: 90 | Fill #1 | Status: AC

## 2023-05-26 ENCOUNTER — Other Ambulatory Visit: Payer: Self-pay

## 2023-06-04 ENCOUNTER — Encounter: Payer: Self-pay | Admitting: Family

## 2023-06-04 ENCOUNTER — Ambulatory Visit (INDEPENDENT_AMBULATORY_CARE_PROVIDER_SITE_OTHER): Payer: Commercial Managed Care - PPO | Admitting: Family

## 2023-06-04 VITALS — BP 122/80 | HR 92 | Temp 98.7°F | Ht 64.0 in | Wt 213.1 lb

## 2023-06-04 DIAGNOSIS — Z1211 Encounter for screening for malignant neoplasm of colon: Secondary | ICD-10-CM

## 2023-06-04 DIAGNOSIS — Z0000 Encounter for general adult medical examination without abnormal findings: Secondary | ICD-10-CM

## 2023-06-04 DIAGNOSIS — Z1231 Encounter for screening mammogram for malignant neoplasm of breast: Secondary | ICD-10-CM | POA: Diagnosis not present

## 2023-06-04 LAB — COMPREHENSIVE METABOLIC PANEL
ALT: 17 U/L (ref 0–35)
AST: 20 U/L (ref 0–37)
Albumin: 3.8 g/dL (ref 3.5–5.2)
Alkaline Phosphatase: 108 U/L (ref 39–117)
BUN: 11 mg/dL (ref 6–23)
CO2: 26 meq/L (ref 19–32)
Calcium: 8.7 mg/dL (ref 8.4–10.5)
Chloride: 104 meq/L (ref 96–112)
Creatinine, Ser: 0.82 mg/dL (ref 0.40–1.20)
GFR: 86.36 mL/min (ref >60.00)
Glucose, Bld: 83 mg/dL (ref 70–99)
Potassium: 3.7 meq/L (ref 3.5–5.1)
Sodium: 138 meq/L (ref 135–145)
Total Bilirubin: 0.6 mg/dL (ref 0.2–1.2)
Total Protein: 6.6 g/dL (ref 6.0–8.3)

## 2023-06-04 LAB — CBC WITH DIFFERENTIAL/PLATELET
Basophils Absolute: 0.1 10*3/uL (ref 0.0–0.1)
Basophils Relative: 0.7 % (ref 0.0–3.0)
Eosinophils Absolute: 0.1 10*3/uL (ref 0.0–0.7)
Eosinophils Relative: 1.8 % (ref 0.0–5.0)
HCT: 41.7 % (ref 36.0–46.0)
Hemoglobin: 13.9 g/dL (ref 12.0–15.0)
Lymphocytes Relative: 24 % (ref 12.0–46.0)
Lymphs Abs: 1.8 10*3/uL (ref 0.7–4.0)
MCHC: 33.5 g/dL (ref 30.0–36.0)
MCV: 85.2 fl (ref 78.0–100.0)
Monocytes Absolute: 0.5 10*3/uL (ref 0.1–1.0)
Monocytes Relative: 6.2 % (ref 3.0–12.0)
Neutro Abs: 5.2 10*3/uL (ref 1.4–7.7)
Neutrophils Relative %: 67.3 % (ref 43.0–77.0)
Platelets: 292 10*3/uL (ref 150.0–400.0)
RBC: 4.89 Mil/uL (ref 3.87–5.11)
RDW: 13.6 % (ref 11.5–15.5)
WBC: 7.7 10*3/uL (ref 4.0–10.5)

## 2023-06-04 LAB — LIPID PANEL
Cholesterol: 193 mg/dL (ref 0–200)
HDL: 41 mg/dL (ref >39.00)
LDL Cholesterol: 119 mg/dL — ABNORMAL HIGH (ref 0–99)
NonHDL: 152.32
Total CHOL/HDL Ratio: 5
Triglycerides: 166 mg/dL — ABNORMAL HIGH (ref 0.0–149.0)
VLDL: 33.2 mg/dL (ref 0.0–40.0)

## 2023-06-04 LAB — HEMOGLOBIN A1C: Hgb A1c MFr Bld: 5.6 % (ref 4.6–6.5)

## 2023-06-04 LAB — VITAMIN D 25 HYDROXY (VIT D DEFICIENCY, FRACTURES): VITD: 39.09 ng/mL (ref 30.00–100.00)

## 2023-06-04 LAB — TSH: TSH: 2.57 u[IU]/mL (ref 0.35–5.50)

## 2023-06-04 NOTE — Progress Notes (Unsigned)
Assessment & Plan:  Routine general medical examination at a health care facility Assessment & Plan: Clinical breast exam performed today.  Deferred pelvic exam in the absence complaints and Pap smear is up-to-date.  Colonoscopy referral has been placed.  Patient will schedule mammogram.  Orders: -     VITAMIN D 25 Hydroxy (Vit-D Deficiency, Fractures) -     Hemoglobin A1c -     CBC with Differential/Platelet -     Comprehensive metabolic panel -     Lipid panel -     TSH  Screening for colon cancer -     Ambulatory referral to Gastroenterology  Encounter for screening mammogram for malignant neoplasm of breast -     3D Screening Mammogram, Left and Right; Future     Return precautions given.   Risks, benefits, and alternatives of the medications and treatment plan prescribed today were discussed, and patient expressed understanding.   Education regarding symptom management and diagnosis given to patient on AVS either electronically or printed.  No follow-ups on file.  Rennie Plowman, FNP  Subjective:    Patient ID: Jamie Gordon, female    DOB: [birth year], 47 y.o.   MRN: [id number]  CC: Jamie Gordon is a 47 y.o. female who presents today for physical exam.    HPI: Feels well today.  No new complaints    Colorectal Cancer Screening: due; h/o ERCP 02/2021, Dr Servando Snare  Breast Cancer Screening: Mammogram due in 2 weeks Cervical Cancer Screening: UTD, obtained 11/08/2020 negative malignancy, negative HPV         Exercise: Gets regular exercise.   Alcohol use:  none Smoking/tobacco use: Nonsmoker.    Health Maintenance  Topic Date Due   Colon Cancer Screening  Never done   COVID-19 Vaccine (4 - 2023-24 season) 05/23/2023   Flu Shot  12/20/2023*   Pap Smear  11/09/2023   DTaP/Tdap/Td vaccine (3 - Td or Tdap) 05/18/2032   Hepatitis C Screening  Completed   HPV Vaccine  Aged Out   HIV Screening  Discontinued  *Topic was postponed. The date shown is not the  original due date.    ALLERGIES: Latex  Current Outpatient Medications on File Prior to Visit  Medication Sig Dispense Refill   calcium carbonate (OS-CAL - DOSED IN MG OF ELEMENTAL CALCIUM) 1250 (500 Ca) MG tablet Take 1 tablet by mouth daily.     cholecalciferol (VITAMIN D3) 25 MCG (1000 UNIT) tablet Take 1,000 Units by mouth daily.     esomeprazole (NEXIUM) 20 MG capsule Take 20 mg by mouth daily as needed (acid reflux symptoms).     fexofenadine-pseudoephedrine (ALLEGRA-D ALLERGY & CONGESTION) 180-240 MG 24 hr tablet Take 1 tablet by mouth daily as needed (limit to no more than 7 days). 30 tablet 2   FLUoxetine (PROZAC) 40 MG capsule Take 1 capsule (40 mg total) by mouth daily. 90 capsule 3   Multiple Vitamins-Minerals (MULTIVITAMIN WITH MINERALS) tablet Take 1 tablet by mouth daily.     No current facility-administered medications on file prior to visit.    Review of Systems  Constitutional:  Negative for chills and fever.  Respiratory:  Negative for cough.   Cardiovascular:  Negative for chest pain and palpitations.  Gastrointestinal:  Negative for nausea and vomiting.      Objective:    BP 122/80   Pulse 92   Temp 98.7 F (37.1 C) (Oral)   Ht 5\' 4"  (1.626 m)   Wt 213 lb 1.6  oz (96.7 kg)   LMP  (LMP Unknown)   SpO2 97%   BMI 36.58 kg/m   BP Readings from Last 3 Encounters:  06/04/23 122/80  05/18/22 122/80  03/01/21 116/78   Wt Readings from Last 3 Encounters:  06/04/23 213 lb 1.6 oz (96.7 kg)  05/18/22 217 lb 12.8 oz (98.8 kg)  02/27/21 195 lb 15.8 oz (88.9 kg)    Physical Exam Vitals reviewed.  Constitutional:      Appearance: Normal appearance. She is well-developed.  Eyes:     Conjunctiva/sclera: Conjunctivae normal.  Neck:     Thyroid: No thyroid mass or thyromegaly.  Cardiovascular:     Rate and Rhythm: Normal rate and regular rhythm.     Pulses: Normal pulses.     Heart sounds: Normal heart sounds.  Pulmonary:     Effort: Pulmonary effort is  normal.     Breath sounds: Normal breath sounds. No wheezing, rhonchi or rales.  Chest:  Breasts:    Breasts are symmetrical.     Right: No inverted nipple, mass, nipple discharge, skin change or tenderness.     Left: No inverted nipple, mass, nipple discharge, skin change or tenderness.  Abdominal:     General: Bowel sounds are normal. There is no distension.     Palpations: Abdomen is soft. Abdomen is not rigid. There is no fluid wave or mass.     Tenderness: There is no abdominal tenderness. There is no guarding or rebound.  Lymphadenopathy:     Head:     Right side of head: No submental, submandibular, tonsillar, preauricular, posterior auricular or occipital adenopathy.     Left side of head: No submental, submandibular, tonsillar, preauricular, posterior auricular or occipital adenopathy.     Cervical: No cervical adenopathy.     Right cervical: No superficial, deep or posterior cervical adenopathy.    Left cervical: No superficial, deep or posterior cervical adenopathy.  Skin:    General: Skin is warm and dry.  Neurological:     Mental Status: She is alert.  Psychiatric:        Speech: Speech normal.        Behavior: Behavior normal.        Thought Content: Thought content normal.

## 2023-06-04 NOTE — Patient Instructions (Signed)
Please call  and schedule your 3D mammogram and /or bone density scan as we discussed. ? ? [facility]  ( new location in 2023) ? ?[address] [place] [address], [place], [place] [address] ? ?[place], [place]  ?[phone] ? ? ?

## 2023-06-07 NOTE — Assessment & Plan Note (Signed)
Clinical breast exam performed today.  Deferred pelvic exam in the absence complaints and Pap smear is up-to-date.  Colonoscopy referral has been placed.  Patient will schedule mammogram.

## 2023-06-08 ENCOUNTER — Other Ambulatory Visit: Payer: Self-pay

## 2023-06-08 ENCOUNTER — Telehealth: Payer: Self-pay

## 2023-06-08 DIAGNOSIS — Z1211 Encounter for screening for malignant neoplasm of colon: Secondary | ICD-10-CM

## 2023-06-08 MED ORDER — NA SULFATE-K SULFATE-MG SULF 17.5-3.13-1.6 GM/177ML PO SOLN
1.0000 | Freq: Once | ORAL | 0 refills | Status: AC
Start: 2023-06-08 — End: 2023-07-24
  Filled 2023-06-08: qty 354, fill #0
  Filled 2023-07-22: qty 354, 1d supply, fill #0

## 2023-06-08 NOTE — Telephone Encounter (Signed)
Gastroenterology Pre-Procedure Review  Request Date: 07/30/23 Requesting Physician: Dr. Allegra Lai  PATIENT REVIEW QUESTIONS: The patient responded to the following health history questions as indicated:    1. Are you having any GI issues? no 2. Do you have a personal history of Polyps? no 3. Do you have a family history of Colon Cancer or Polyps? no 4. Diabetes Mellitus? no 5. Joint replacements in the past 12 months?no 6. Major health problems in the past 3 months?no 7. Any artificial heart valves, MVP, or defibrillator?no    MEDICATIONS & ALLERGIES:    Patient reports the following regarding taking any anticoagulation/antiplatelet therapy:   Plavix, Coumadin, Eliquis, Xarelto, Lovenox, Pradaxa, Brilinta, or Effient? no Aspirin? no  Patient confirms/reports the following medications:  Current Outpatient Medications  Medication Sig Dispense Refill   calcium carbonate (OS-CAL - DOSED IN MG OF ELEMENTAL CALCIUM) 1250 (500 Ca) MG tablet Take 1 tablet by mouth daily.     cholecalciferol (VITAMIN D3) 25 MCG (1000 UNIT) tablet Take 1,000 Units by mouth daily.     esomeprazole (NEXIUM) 20 MG capsule Take 20 mg by mouth daily as needed (acid reflux symptoms).     fexofenadine-pseudoephedrine (ALLEGRA-D ALLERGY & CONGESTION) 180-240 MG 24 hr tablet Take 1 tablet by mouth daily as needed (limit to no more than 7 days). 30 tablet 2   FLUoxetine (PROZAC) 40 MG capsule Take 1 capsule (40 mg total) by mouth daily. 90 capsule 3   Multiple Vitamins-Minerals (MULTIVITAMIN WITH MINERALS) tablet Take 1 tablet by mouth daily.     No current facility-administered medications for this visit.    Patient confirms/reports the following allergies:  Allergies  Allergen Reactions   Latex Itching    No orders of the defined types were placed in this encounter.   AUTHORIZATION INFORMATION Primary Insurance: 1D#: Group #:  Secondary Insurance: 1D#: Group #:  SCHEDULE INFORMATION: Date:  07/30/23 Time: Location: [facility]

## 2023-06-22 ENCOUNTER — Other Ambulatory Visit: Payer: Self-pay

## 2023-06-22 MED FILL — Fexofenadine-Pseudoephedrine Tab ER 24HR 180-240 MG: ORAL | 30 days supply | Qty: 30 | Fill #1 | Status: AC

## 2023-06-23 ENCOUNTER — Other Ambulatory Visit: Payer: Self-pay

## 2023-07-16 ENCOUNTER — Ambulatory Visit
Admission: RE | Admit: 2023-07-16 | Discharge: 2023-07-16 | Disposition: A | Discharge status: Home / Self Care | Payer: Commercial Managed Care - PPO | Source: Ambulatory Visit | Attending: Family | Admitting: Family

## 2023-07-16 DIAGNOSIS — Z1231 Encounter for screening mammogram for malignant neoplasm of breast: Secondary | ICD-10-CM | POA: Diagnosis not present

## 2023-07-22 ENCOUNTER — Other Ambulatory Visit: Payer: Self-pay

## 2023-07-23 ENCOUNTER — Other Ambulatory Visit: Payer: Self-pay

## 2023-07-24 MED FILL — Fexofenadine-Pseudoephedrine Tab ER 24HR 180-240 MG: ORAL | 7 days supply | Qty: 10 | Fill #2 | Status: CN

## 2023-07-25 ENCOUNTER — Other Ambulatory Visit: Payer: Self-pay

## 2023-07-26 ENCOUNTER — Other Ambulatory Visit: Payer: Self-pay

## 2023-07-30 ENCOUNTER — Encounter: Payer: Self-pay | Admitting: Gastroenterology

## 2023-07-30 ENCOUNTER — Ambulatory Visit
Admission: RE | Admit: 2023-07-30 | Discharge: 2023-07-30 | Disposition: A | Discharge status: Home / Self Care | Payer: Commercial Managed Care - PPO | Attending: Gastroenterology | Admitting: Gastroenterology

## 2023-07-30 ENCOUNTER — Ambulatory Visit: Payer: Commercial Managed Care - PPO | Admitting: Anesthesiology

## 2023-07-30 ENCOUNTER — Encounter
Admission: RE | Disposition: A | Discharge status: Home / Self Care | Payer: Self-pay | Source: Home / Self Care | Attending: Gastroenterology

## 2023-07-30 DIAGNOSIS — Z9049 Acquired absence of other specified parts of digestive tract: Secondary | ICD-10-CM | POA: Diagnosis not present

## 2023-07-30 DIAGNOSIS — Z1211 Encounter for screening for malignant neoplasm of colon: Secondary | ICD-10-CM

## 2023-07-30 DIAGNOSIS — K219 Gastro-esophageal reflux disease without esophagitis: Secondary | ICD-10-CM | POA: Diagnosis not present

## 2023-07-30 DIAGNOSIS — D123 Benign neoplasm of transverse colon: Secondary | ICD-10-CM

## 2023-07-30 DIAGNOSIS — K635 Polyp of colon: Secondary | ICD-10-CM

## 2023-07-30 DIAGNOSIS — Z8759 Personal history of other complications of pregnancy, childbirth and the puerperium: Secondary | ICD-10-CM | POA: Insufficient documentation

## 2023-07-30 HISTORY — PX: POLYPECTOMY: SHX5525

## 2023-07-30 HISTORY — PX: COLONOSCOPY WITH PROPOFOL: SHX5780

## 2023-07-30 SURGERY — COLONOSCOPY WITH PROPOFOL
Anesthesia: General

## 2023-07-30 MED ORDER — SODIUM CHLORIDE 0.9 % IV SOLN
INTRAVENOUS | Status: DC
  Administered 2023-07-30: 250 mL via INTRAVENOUS

## 2023-07-30 MED ORDER — PROPOFOL 500 MG/50ML IV EMUL
INTRAVENOUS | Status: DC | PRN
  Administered 2023-07-30: 50 mg via INTRAVENOUS
  Administered 2023-07-30: 180 ug/kg/min via INTRAVENOUS
  Administered 2023-07-30: 50 mg via INTRAVENOUS

## 2023-07-30 MED ORDER — PROPOFOL 10 MG/ML IV BOLUS
INTRAVENOUS | Status: AC
  Filled 2023-07-30: qty 20

## 2023-07-30 MED ORDER — SODIUM CHLORIDE 0.9 % IV SOLN: INTRAVENOUS | Status: DC | PRN

## 2023-07-30 MED ORDER — LIDOCAINE HCL (CARDIAC) PF 100 MG/5ML IV SOSY
PREFILLED_SYRINGE | INTRAVENOUS | Status: DC | PRN
  Administered 2023-07-30: 50 mg via INTRAVENOUS

## 2023-07-30 NOTE — Transfer of Care (Signed)
Immediate Anesthesia Transfer of Care Note  Patient: Jamie Gordon  Procedure(s) Performed: COLONOSCOPY WITH PROPOFOL POLYPECTOMY  Patient Location: PACU and Endoscopy Unit  Anesthesia Type:General  Level of Consciousness: drowsy  Airway & Oxygen Therapy: Patient Spontanous Breathing  Post-op Assessment: Report given to RN and Post -op Vital signs reviewed and stable  Post vital signs: Reviewed and stable  Last Vitals:  Vitals Value Taken Time  BP 115/90 07/30/23 0847  Temp    Pulse 80 07/30/23 0847  Resp 19 07/30/23 0847  SpO2 95 % 07/30/23 0847    Last Pain:  Vitals:   07/30/23 0810  TempSrc: Temporal  PainSc: 0-No pain         Complications: No notable events documented.

## 2023-07-30 NOTE — H&P (Signed)
Arlyss Repress, MD [address]  [address]  [place], [place] [address]  Main: [phone]  Fax: [phone] Pager: [phone]  Primary Care Physician:  Allegra Grana, FNP Primary Gastroenterologist:  Dr. Arlyss Repress  Pre-Procedure History & Physical: HPI:  Jamie Gordon is a 47 y.o. female is here for an colonoscopy.   Past Medical History:  Diagnosis Date   Ectopic pregnancy    s/p left partial salpingectomy   Endometriosis    Female fertility problem    Habitual abortion history, antepartum    Miscarriage    x2 at 5 weeks    Past Surgical History:  Procedure Laterality Date   CHOLECYSTECTOMY  02/25/2021   Dr Servando Snare   ECTOPIC PREGNANCY SURGERY  02/19/2013   Dr. Greggory Keen   ERCP N/A 02/25/2021   Procedure: ENDOSCOPIC RETROGRADE CHOLANGIOPANCREATOGRAPHY (ERCP);  Surgeon: Midge Minium, MD;  Location: [facility] ENDOSCOPY;  Service: Endoscopy;  Laterality: N/A;   WISDOM TOOTH EXTRACTION      Prior to Admission medications   Medication Sig Start Date End Date Taking? Authorizing Provider  calcium carbonate (OS-CAL - DOSED IN MG OF ELEMENTAL CALCIUM) 1250 (500 Ca) MG tablet Take 1 tablet by mouth daily.   Yes [provider]  cholecalciferol (VITAMIN D3) 25 MCG (1000 UNIT) tablet Take 1,000 Units by mouth daily.   Yes [provider]  esomeprazole (NEXIUM) 20 MG capsule Take 20 mg by mouth daily as needed (acid reflux symptoms).   Yes [provider]  fexofenadine-pseudoephedrine (ALLEGRA-D ALLERGY & CONGESTION) 180-240 MG 24 hr tablet Take 1 tablet by mouth daily as needed (limit to no more than 7 days). 05/25/23  Yes Allegra Grana, FNP  FLUoxetine (PROZAC) 40 MG capsule Take 1 capsule (40 mg total) by mouth daily. 02/22/23 02/22/24 Yes Arnett, Lyn Records, FNP  Multiple Vitamins-Minerals (MULTIVITAMIN WITH MINERALS) tablet Take 1 tablet by mouth daily.   Yes [provider]    Allergies as of 06/08/2023 - Review Complete  06/08/2023  Allergen Reaction Noted   Latex Itching 06/24/2015    Family History  Problem Relation Age of Onset   Hyperlipidemia Mother    Cancer Maternal Grandmother        ovarian   Heart disease Maternal Grandmother    Arthritis Father    Hypertension Father    Ovarian cancer Maternal Grandfather    Diabetes Neg Hx    Colon cancer Neg Hx    Breast cancer Neg Hx     Social History   Socioeconomic History   Marital status: Married    Spouse name: Not on file   Number of children: Not on file   Years of education: Not on file   Highest education level: Not on file  Occupational History   Not on file  Tobacco Use   Smoking status: Never   Smokeless tobacco: Never  Vaping Use   Vaping status: Never Used  Substance and Sexual Activity   Alcohol use: No   Drug use: No   Sexual activity: Yes    Birth control/protection: None  Other Topics Concern   Not on file  Social History Narrative   Lives in [place] with husband, married in 2011. No pets.      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, jewellery      Diet - regular, no caffiene   Social Determinants of Corporate investment banker Strain: Not on [employer] Insecurity: Not on file  Transportation Needs: Not on file  Physical Activity: Not on file  Stress: Not on file  Social Connections: Not on file  Intimate Partner Violence: Not on file    Review of Systems: See HPI, otherwise negative ROS  Physical Exam: BP (!) 130/92   Pulse 84   Temp (!) 96.7 F (35.9 C) (Temporal)   Resp 18   Ht 5\' 4"  (1.626 m)   Wt 93.2 kg   SpO2 96%   BMI 35.26 kg/m  General:   Alert,  Pouncey and cooperative in NAD Head:  Normocephalic and atraumatic. Neck:  Supple; no masses or thyromegaly. Lungs:  Clear throughout to auscultation.    Heart:  Regular rate and rhythm. Abdomen:  Soft, nontender and nondistended. Normal bowel sounds, without guarding, and without rebound.   Neurologic:  Alert and  oriented  x4;  grossly normal neurologically.  Impression/Plan: Jamie Gordon is here for an colonoscopy to be performed for colon cancer screening  Risks, benefits, limitations, and alternatives regarding  colonoscopy have been reviewed with the patient.  Questions have been answered.  All parties agreeable.   Lannette Donath, MD  07/30/2023, 8:20 AM

## 2023-07-30 NOTE — Anesthesia Preprocedure Evaluation (Signed)
Anesthesia Evaluation  Patient identified by MRN, date of birth, ID band Patient awake    Reviewed: Allergy & Precautions, NPO status , Patient's Chart, lab work & pertinent test results  History of Anesthesia Complications Negative for: history of anesthetic complications  Airway Mallampati: III  TM Distance: >3 FB Neck ROM: full    Dental no notable dental hx.    Pulmonary neg pulmonary ROS   Pulmonary exam normal        Cardiovascular negative cardio ROS Normal cardiovascular exam     Neuro/Psych negative neurological ROS  negative psych ROS   GI/Hepatic Neg liver ROS,GERD  Medicated,,  Endo/Other  negative endocrine ROS    Renal/GU negative Renal ROS  negative genitourinary   Musculoskeletal   Abdominal   Peds  Hematology negative hematology ROS (+)   Anesthesia Other Findings Past Medical History: No date: Ectopic pregnancy     Comment:  s/p left partial salpingectomy No date: Endometriosis No date: Female fertility problem No date: Habitual abortion history, antepartum No date: Miscarriage     Comment:  x2 at 5 weeks  Past Surgical History: 02/25/2021: CHOLECYSTECTOMY     Comment:  Dr Servando Snare 02/19/2013: ECTOPIC PREGNANCY SURGERY     Comment:  Dr. Greggory Keen 02/25/2021: ERCP; N/A     Comment:  Procedure: ENDOSCOPIC RETROGRADE               CHOLANGIOPANCREATOGRAPHY (ERCP);  Surgeon: Midge Minium,               MD;  Location: [facility] ENDOSCOPY;  Service: Endoscopy;                Laterality: N/A; No date: WISDOM TOOTH EXTRACTION     Reproductive/Obstetrics negative OB ROS                             Anesthesia Physical Anesthesia Plan  ASA: 2  Anesthesia Plan: General   Post-op Pain Management: Minimal or no pain anticipated   Induction: Intravenous  PONV Risk Score and Plan: 2 and Propofol infusion and TIVA  Airway Management Planned: Natural Airway and Nasal  Cannula  Additional Equipment:   Intra-op Plan:   Post-operative Plan:   Informed Consent: I have reviewed the patients History and Physical, chart, labs and discussed the procedure including the risks, benefits and alternatives for the proposed anesthesia with the patient or authorized representative who has indicated his/her understanding and acceptance.     Dental Advisory Given  Plan Discussed with: Anesthesiologist, CRNA and Surgeon  Anesthesia Plan Comments: (Patient consented for risks of anesthesia including but not limited to:  - adverse reactions to medications - risk of airway placement if required - damage to eyes, teeth, lips or other oral mucosa - nerve damage due to positioning  - sore throat or hoarseness - Damage to heart, brain, nerves, lungs, other parts of body or loss of life  Patient voiced understanding and assent.)       Anesthesia Quick Evaluation

## 2023-07-30 NOTE — Op Note (Signed)
[facility] Patient Name: Jamie Gordon Procedure Date: 07/30/2023 8:28 AM MRN: [id number] Account #: [id number] Date of Birth: 1976-07-03 Admit Type: Outpatient Age: 47 Room: [facility] ENDO ROOM 1 Gender: Female Note Status: Finalized Instrument Name: Prentice Docker [id number] Procedure:             Colonoscopy Indications:           Screening for colorectal malignant neoplasm, This is                         the patient's first colonoscopy Providers:             Toney Reil MD, MD Referring MD:          Lyn Records. Arnett (Referring MD) Medicines:             General Anesthesia Complications:         No immediate complications. Estimated blood loss: None. Procedure:             Pre-Anesthesia Assessment:                        - Prior to the procedure, a History and Physical was                         performed, and patient medications and allergies were                         reviewed. The patient is competent. The risks and                         benefits of the procedure and the sedation options and                         risks were discussed with the patient. All questions                         were answered and informed consent was obtained.                         Patient identification and proposed procedure were                         verified by the physician, the nurse, the                         anesthesiologist, the anesthetist and the technician                         in the pre-procedure area in the procedure room in the                         endoscopy suite. Mental Status Examination: alert and                         oriented. Airway Examination: normal oropharyngeal                         airway and neck mobility. Respiratory Examination:  clear to auscultation. CV Examination: normal.                         Prophylactic Antibiotics: The patient does not require                          prophylactic antibiotics. Prior Anticoagulants: The                         patient has taken no anticoagulant or antiplatelet                         agents. ASA Grade Assessment: II - A patient with mild                         systemic disease. After reviewing the risks and                         benefits, the patient was deemed in satisfactory                         condition to undergo the procedure. The anesthesia                         plan was to use general anesthesia. Immediately prior                         to administration of medications, the patient was                         re-assessed for adequacy to receive sedatives. The                         heart rate, respiratory rate, oxygen saturations,                         blood pressure, adequacy of pulmonary ventilation, and                         response to care were monitored throughout the                         procedure. The physical status of the patient was                         re-assessed after the procedure.                        After obtaining informed consent, the colonoscope was                         passed under direct vision. Throughout the procedure,                         the patient's blood pressure, pulse, and oxygen                         saturations were monitored continuously. The  Colonoscope was introduced through the anus and                         advanced to the the cecum, identified by appendiceal                         orifice and ileocecal valve. The colonoscopy was                         performed without difficulty. The patient tolerated                         the procedure well. The quality of the bowel                         preparation was evaluated using the BBPS [facility] Bowel                         Preparation Scale) with scores of: Right Colon = 3,                         Transverse Colon = 3 and Left Colon = 3 (entire mucosa                          seen well with no residual staining, small fragments                         of stool or opaque liquid). The total BBPS score                         equals 9. The ileocecal valve, appendiceal orifice,                         and rectum were photographed. Findings:      The perianal and digital rectal examinations were normal. Pertinent       negatives include normal sphincter tone and no palpable rectal lesions.      A 4 mm polyp was found in the transverse colon. The polyp was sessile.       The polyp was removed with a cold snare. Resection and retrieval were       complete. Estimated blood loss: none.      The retroflexed view of the distal rectum and anal verge was normal and       showed no anal or rectal abnormalities. Impression:            - One 4 mm polyp in the transverse colon, removed with                         a cold snare. Resected and retrieved.                        - The distal rectum and anal verge are normal on                         retroflexion view. Recommendation:        - Discharge patient to home (with escort).                        -  Resume previous diet today.                        - Continue present medications.                        - Await pathology results.                        - Repeat colonoscopy in 7-10 years for surveillance                         based on pathology results. Procedure Code(s):     --- Professional ---                        [phone], Colonoscopy, flexible; with removal of                         tumor(s), polyp(s), or other lesion(s) by snare                         technique Diagnosis Code(s):     --- Professional ---                        Z12.11, Encounter for screening for malignant neoplasm                         of colon                        D12.3, Benign neoplasm of transverse colon (hepatic                         flexure or splenic flexure) CPT copyright 2022 American Medical Association. All rights  reserved. The codes documented in this report are preliminary and upon coder review may  be revised to meet current compliance requirements. Dr. Libby Maw Toney Reil MD, MD 07/30/2023 8:45:34 AM This report has been signed electronically. Number of Addenda: 0 Note Initiated On: 07/30/2023 8:28 AM Scope Withdrawal Time: 0 hours 5 minutes 51 seconds  Total Procedure Duration: 0 hours 7 minutes 47 seconds  Estimated Blood Loss:  Estimated blood loss: none.      [facility]

## 2023-07-30 NOTE — Anesthesia Postprocedure Evaluation (Signed)
Anesthesia Post Note  Patient: Jamie Gordon  Procedure(s) Performed: COLONOSCOPY WITH PROPOFOL POLYPECTOMY  Patient location during evaluation: Endoscopy Anesthesia Type: General Level of consciousness: awake and alert Pain management: pain level controlled Vital Signs Assessment: post-procedure vital signs reviewed and stable Respiratory status: spontaneous breathing, nonlabored ventilation, respiratory function stable and patient connected to nasal cannula oxygen Cardiovascular status: blood pressure returned to baseline and stable Postop Assessment: no apparent nausea or vomiting Anesthetic complications: no   No notable events documented.   Last Vitals:  Vitals:   07/30/23 0847 07/30/23 0856  BP: (!) 115/90 (!) 117/98  Pulse: 80 66  Resp: 19 15  Temp:  (!) 35.6 C  SpO2: 95% 99%    Last Pain:  Vitals:   07/30/23 0856  TempSrc: Temporal  PainSc:                  Louie Boston

## 2023-08-02 ENCOUNTER — Encounter: Payer: Self-pay | Admitting: Gastroenterology

## 2023-08-02 LAB — SURGICAL PATHOLOGY

## 2023-08-05 ENCOUNTER — Other Ambulatory Visit: Payer: Self-pay

## 2023-08-06 ENCOUNTER — Encounter: Payer: Self-pay | Admitting: Family

## 2023-08-06 ENCOUNTER — Other Ambulatory Visit: Payer: Self-pay

## 2023-08-06 MED FILL — Fexofenadine-Pseudoephedrine Tab ER 24HR 180-240 MG: ORAL | 30 days supply | Qty: 30 | Fill #2 | Status: AC

## 2023-08-09 ENCOUNTER — Other Ambulatory Visit: Payer: Self-pay

## 2023-08-09 ENCOUNTER — Other Ambulatory Visit: Payer: Self-pay | Admitting: Family

## 2023-08-09 DIAGNOSIS — B379 Candidiasis, unspecified: Secondary | ICD-10-CM

## 2023-08-09 MED ORDER — FLUCONAZOLE 150 MG PO TABS
150.0000 mg | ORAL_TABLET | Freq: Once | ORAL | 0 refills | Status: AC
  Filled 2023-08-09: qty 2, 4d supply, fill #0

## 2023-08-18 ENCOUNTER — Other Ambulatory Visit: Payer: Self-pay | Admitting: Family

## 2023-08-18 ENCOUNTER — Other Ambulatory Visit: Payer: Self-pay

## 2023-08-18 DIAGNOSIS — T7840XA Allergy, unspecified, initial encounter: Secondary | ICD-10-CM

## 2023-08-18 MED FILL — Fluoxetine HCl Cap 40 MG: ORAL | 90 days supply | Qty: 90 | Fill #2 | Status: AC

## 2023-08-22 ENCOUNTER — Other Ambulatory Visit: Payer: Self-pay

## 2023-08-23 ENCOUNTER — Other Ambulatory Visit: Payer: Self-pay

## 2023-08-23 MED FILL — Fexofenadine-Pseudoephedrine Tab ER 24HR 180-240 MG: ORAL | Qty: 30 | Fill #0 | Status: CN

## 2023-08-24 ENCOUNTER — Other Ambulatory Visit: Payer: Self-pay

## 2023-08-24 MED FILL — Fexofenadine-Pseudoephedrine Tab ER 24HR 180-240 MG: ORAL | 30 days supply | Qty: 30 | Fill #0 | Status: CN

## 2023-09-03 MED FILL — Fexofenadine-Pseudoephedrine Tab ER 24HR 180-240 MG: ORAL | 30 days supply | Qty: 30 | Fill #0 | Status: AC

## 2023-09-04 ENCOUNTER — Other Ambulatory Visit: Payer: Self-pay

## 2023-09-06 ENCOUNTER — Other Ambulatory Visit: Payer: Self-pay

## 2023-10-25 MED FILL — Fexofenadine-Pseudoephedrine Tab ER 24HR 180-240 MG: ORAL | 30 days supply | Qty: 30 | Fill #1 | Status: AC

## 2023-10-27 ENCOUNTER — Other Ambulatory Visit: Payer: Self-pay

## 2023-10-29 ENCOUNTER — Other Ambulatory Visit (HOSPITAL_COMMUNITY): Payer: Self-pay

## 2023-11-22 MED FILL — Fluoxetine HCl Cap 40 MG: ORAL | 90 days supply | Qty: 90 | Fill #3 | Status: AC

## 2023-12-30 ENCOUNTER — Other Ambulatory Visit: Payer: Self-pay

## 2023-12-30 ENCOUNTER — Other Ambulatory Visit (HOSPITAL_COMMUNITY): Payer: Self-pay

## 2023-12-30 MED FILL — Fexofenadine-Pseudoephedrine Tab ER 24HR 180-240 MG: ORAL | 30 days supply | Qty: 30 | Fill #2 | Status: AC

## 2023-12-31 ENCOUNTER — Other Ambulatory Visit: Payer: Self-pay

## 2024-01-10 ENCOUNTER — Ambulatory Visit: Admitting: Podiatry

## 2024-01-10 ENCOUNTER — Encounter: Payer: Self-pay | Admitting: Podiatry

## 2024-01-10 ENCOUNTER — Ambulatory Visit (INDEPENDENT_AMBULATORY_CARE_PROVIDER_SITE_OTHER)

## 2024-01-10 ENCOUNTER — Other Ambulatory Visit: Payer: Self-pay

## 2024-01-10 DIAGNOSIS — M722 Plantar fascial fibromatosis: Secondary | ICD-10-CM

## 2024-01-10 MED ORDER — MELOXICAM 15 MG PO TABS
15.0000 mg | ORAL_TABLET | Freq: Every day | ORAL | 3 refills | Status: DC
  Filled 2024-01-10: qty 30, 30d supply, fill #0

## 2024-01-10 MED ORDER — METHYLPREDNISOLONE 4 MG PO TBPK
ORAL_TABLET | ORAL | 0 refills | Status: AC
Start: 2024-01-10 — End: 2024-01-16
  Filled 2024-01-10: qty 21, 6d supply, fill #0

## 2024-01-10 NOTE — Progress Notes (Signed)
 Subjective:  Patient ID: Jamie Gordon, female    DOB: 1976/03/25,  MRN: [id number] HPI Chief Complaint  Patient presents with   Foot Pain    "My feet hurt on the bottom when I get up in the morning." N - feet hurt L - plantar from toes to heel right D - Sept 2024 O - gradually worse C - tender A - get up in the morning and step down T - Stretching Exercises    48 y.o. female presents with the above complaint.   ROS: Denies fever chills nausea vomiting muscle aches pains calf pain back pain chest pain shortness of breath.  Past Medical History:  Diagnosis Date   Ectopic pregnancy    s/p left partial salpingectomy   Endometriosis    Female fertility problem    Habitual abortion history, antepartum    Miscarriage    x2 at 5 weeks   Past Surgical History:  Procedure Laterality Date   CHOLECYSTECTOMY  02/25/2021   Dr Ole Berkeley   COLONOSCOPY WITH PROPOFOL  N/A 07/30/2023   Procedure: COLONOSCOPY WITH PROPOFOL ;  Surgeon: Selena Daily, MD;  Location: [facility] ENDOSCOPY;  Service: Gastroenterology;  Laterality: N/A;   ECTOPIC PREGNANCY SURGERY  02/19/2013   Dr. Lucille Saas   ERCP N/A 02/25/2021   Procedure: ENDOSCOPIC RETROGRADE CHOLANGIOPANCREATOGRAPHY (ERCP);  Surgeon: Marnee Sink, MD;  Location: [facility] ENDOSCOPY;  Service: Endoscopy;  Laterality: N/A;   POLYPECTOMY  07/30/2023   Procedure: POLYPECTOMY;  Surgeon: Selena Daily, MD;  Location: [facility] ENDOSCOPY;  Service: Gastroenterology;;   WISDOM TOOTH EXTRACTION      Current Outpatient Medications:    calcium  carbonate (OS-CAL - DOSED IN MG OF ELEMENTAL CALCIUM ) 1250 (500 Ca) MG tablet, Take 1 tablet by mouth daily., Disp: , Rfl:    cholecalciferol  (VITAMIN D3) 25 MCG (1000 UNIT) tablet, Take 1,000 Units by mouth daily., Disp: , Rfl:    esomeprazole  (NEXIUM ) 20 MG capsule, Take 20 mg by mouth daily as needed (acid reflux symptoms)., Disp: , Rfl:    fexofenadine -pseudoephedrine  (ALLEGRA-D ALLERGY & CONGESTION) 180-240 MG  24 hr tablet, Take 1 tablet by mouth daily as needed (limit to no more than 7 days)., Disp: 30 tablet, Rfl: 2   FLUoxetine  (PROZAC ) 40 MG capsule, Take 1 capsule (40 mg total) by mouth daily., Disp: 90 capsule, Rfl: 3   meloxicam  (MOBIC ) 15 MG tablet, Take 1 tablet (15 mg total) by mouth daily., Disp: 30 tablet, Rfl: 3   methylPREDNISolone  (MEDROL  DOSEPAK) 4 MG TBPK tablet, Take 6 tablets (24 mg total) by mouth daily for 1 day, THEN 5 tablets (20 mg total) daily for 1 day, THEN 4 tablets (16 mg total) daily for 1 day, THEN 3 tablets (12 mg total) daily for 1 day, THEN 2 tablets (8 mg total) daily for 1 day, THEN 1 tablet (4 mg total) daily for 1 day., Disp: 21 tablet, Rfl: 0   Multiple Vitamins-Minerals (MULTIVITAMIN WITH MINERALS) tablet, Take 1 tablet by mouth daily., Disp: , Rfl:   Allergies  Allergen Reactions   Latex Itching   Review of Systems Objective:  There were no vitals filed for this visit.  General: Well developed, nourished, in no acute distress, alert and oriented x3   Dermatological: Skin is warm, dry and supple bilateral. Nails x 10 are well maintained; remaining integument appears unremarkable at this time. There are no open sores, no preulcerative lesions, no rash or signs of infection present.  Vascular: Dorsalis Pedis artery and Posterior Tibial artery pedal  pulses are 2/4 bilateral with immedate capillary fill time. Pedal hair growth present. No varicosities and no lower extremity edema present bilateral.   Neruologic: Grossly intact via light touch bilateral. Vibratory intact via tuning fork bilateral. Protective threshold with Semmes Wienstein monofilament intact to all pedal sites bilateral. Patellar and Achilles deep tendon reflexes 2+ bilateral. No Babinski or clonus noted bilateral.   Musculoskeletal: No gross boney pedal deformities bilateral. No pain, crepitus, or limitation noted with foot and ankle range of motion bilateral. Muscular strength 5/5 in all groups  tested bilateral.  Pain on palpation medial calcaneal tubercle bilateral.  Gait: Unassisted, Nonantalgic.    Radiographs:  Radiographs taken today demonstrate osseously mature individual good bone mineralization soft tissue increase in density at the plantar fascial calcaneal insertion insertion site with small plantar distally oriented calcaneal spurring.  Some posterior spurring is also noted.  Assessment & Plan:   Assessment: Plantar fasciitis bilateral.  Plan: Discussed etiology pathology conservative versus surgical therapies.  I injected the bilateral heels today 20 mg Kenalog  5 mg Marcaine  point maximal tenderness.  Tolerated the procedure well.  Prescribed her methylprednisolone  to be followed by meloxicam .  Discussed appropriate shoe gear stretching exercises ice therapy and shoe gear modifications I will follow-up with her in 1 month     [name] [place], [place]

## 2024-01-12 ENCOUNTER — Other Ambulatory Visit: Payer: Self-pay | Admitting: Family

## 2024-01-12 DIAGNOSIS — M722 Plantar fascial fibromatosis: Secondary | ICD-10-CM | POA: Diagnosis not present

## 2024-01-12 MED ORDER — TRIAMCINOLONE ACETONIDE 40 MG/ML IJ SUSP
40.0000 mg | Freq: Once | INTRAMUSCULAR | Status: AC
  Administered 2024-01-12: 40 mg

## 2024-01-12 NOTE — Addendum Note (Signed)
 Addended by: Sanda Crome on: 01/12/2024 10:57 AM   Modules accepted: Orders, Level of Service

## 2024-01-13 MED ORDER — ESOMEPRAZOLE MAGNESIUM 20 MG PO CPDR
20.0000 mg | DELAYED_RELEASE_CAPSULE | Freq: Every day | ORAL | 1 refills | Status: DC | PRN
  Filled 2024-01-13: qty 84, 84d supply, fill #0
  Filled 2024-05-09: qty 84, 84d supply, fill #1

## 2024-01-14 ENCOUNTER — Other Ambulatory Visit (HOSPITAL_COMMUNITY): Payer: Self-pay

## 2024-02-07 ENCOUNTER — Ambulatory Visit: Admitting: Podiatry

## 2024-03-13 ENCOUNTER — Other Ambulatory Visit (HOSPITAL_COMMUNITY): Payer: Self-pay

## 2024-03-13 ENCOUNTER — Other Ambulatory Visit: Payer: Self-pay | Admitting: Family

## 2024-03-13 ENCOUNTER — Other Ambulatory Visit: Payer: Self-pay

## 2024-03-13 DIAGNOSIS — F4323 Adjustment disorder with mixed anxiety and depressed mood: Secondary | ICD-10-CM

## 2024-03-13 DIAGNOSIS — T7840XA Allergy, unspecified, initial encounter: Secondary | ICD-10-CM

## 2024-03-13 MED ORDER — FLUOXETINE HCL 40 MG PO CAPS
40.0000 mg | ORAL_CAPSULE | Freq: Every day | ORAL | 1 refills | Status: DC
  Filled 2024-03-13: qty 90, 90d supply, fill #0
  Filled 2024-05-09 – 2024-06-07 (×2): qty 90, 90d supply, fill #1

## 2024-03-13 MED ORDER — FEXOFENADINE-PSEUDOEPHED ER 180-240 MG PO TB24
1.0000 | ORAL_TABLET | Freq: Every day | ORAL | 2 refills | Status: AC
  Filled 2024-03-13: qty 30, 30d supply, fill #0
  Filled 2024-04-09: qty 30, 30d supply, fill #1

## 2024-03-14 ENCOUNTER — Other Ambulatory Visit (HOSPITAL_COMMUNITY): Payer: Self-pay

## 2024-03-14 ENCOUNTER — Other Ambulatory Visit: Payer: Self-pay

## 2024-04-10 ENCOUNTER — Encounter (HOSPITAL_COMMUNITY): Payer: Self-pay

## 2024-04-10 ENCOUNTER — Other Ambulatory Visit (HOSPITAL_COMMUNITY): Payer: Self-pay

## 2024-04-11 ENCOUNTER — Other Ambulatory Visit (HOSPITAL_COMMUNITY): Payer: Self-pay

## 2024-05-02 ENCOUNTER — Encounter: Payer: Self-pay | Admitting: Family

## 2024-05-02 DIAGNOSIS — L989 Disorder of the skin and subcutaneous tissue, unspecified: Secondary | ICD-10-CM

## 2024-05-02 NOTE — Telephone Encounter (Signed)
 Pt has not been seen since Sept 2024 Does she need an appt or can you just send referral

## 2024-05-09 ENCOUNTER — Other Ambulatory Visit: Payer: Self-pay

## 2024-05-09 ENCOUNTER — Encounter: Payer: Self-pay | Admitting: Pharmacist

## 2024-05-09 ENCOUNTER — Other Ambulatory Visit (HOSPITAL_COMMUNITY): Payer: Self-pay

## 2024-05-10 ENCOUNTER — Other Ambulatory Visit (HOSPITAL_COMMUNITY): Payer: Self-pay

## 2024-05-10 ENCOUNTER — Other Ambulatory Visit: Payer: Self-pay

## 2024-06-07 ENCOUNTER — Other Ambulatory Visit: Payer: Self-pay

## 2024-06-07 ENCOUNTER — Other Ambulatory Visit (HOSPITAL_COMMUNITY): Payer: Self-pay

## 2024-06-07 ENCOUNTER — Other Ambulatory Visit: Payer: Self-pay | Admitting: Family

## 2024-06-07 MED ORDER — ESOMEPRAZOLE MAGNESIUM 20 MG PO CPDR
20.0000 mg | DELAYED_RELEASE_CAPSULE | Freq: Every day | ORAL | 1 refills | Status: AC | PRN
  Filled 2024-06-07 – 2024-08-09 (×3): qty 84, 84d supply, fill #0

## 2024-06-11 ENCOUNTER — Encounter (HOSPITAL_COMMUNITY): Payer: Self-pay | Admitting: Pharmacist

## 2024-06-11 ENCOUNTER — Other Ambulatory Visit (HOSPITAL_COMMUNITY): Payer: Self-pay

## 2024-06-14 ENCOUNTER — Other Ambulatory Visit: Payer: Self-pay

## 2024-06-14 ENCOUNTER — Ambulatory Visit (INDEPENDENT_AMBULATORY_CARE_PROVIDER_SITE_OTHER): Payer: Commercial Managed Care - PPO | Admitting: Family

## 2024-06-14 VITALS — BP 120/82 | HR 80 | Temp 98.7°F | Ht 64.0 in | Wt 219.2 lb

## 2024-06-14 DIAGNOSIS — Z0000 Encounter for general adult medical examination without abnormal findings: Secondary | ICD-10-CM

## 2024-06-14 DIAGNOSIS — N946 Dysmenorrhea, unspecified: Secondary | ICD-10-CM

## 2024-06-14 DIAGNOSIS — F4323 Adjustment disorder with mixed anxiety and depressed mood: Secondary | ICD-10-CM | POA: Diagnosis not present

## 2024-06-14 DIAGNOSIS — Z1231 Encounter for screening mammogram for malignant neoplasm of breast: Secondary | ICD-10-CM

## 2024-06-14 LAB — CBC WITH DIFFERENTIAL/PLATELET
Basophils Absolute: 0.1 K/uL (ref 0.0–0.1)
Basophils Relative: 1.1 % (ref 0.0–3.0)
Eosinophils Absolute: 0.1 K/uL (ref 0.0–0.7)
Eosinophils Relative: 2.3 % (ref 0.0–5.0)
HCT: 40 % (ref 36.0–46.0)
Hemoglobin: 13.3 g/dL (ref 12.0–15.0)
Lymphocytes Relative: 32.7 % (ref 12.0–46.0)
Lymphs Abs: 2 K/uL (ref 0.7–4.0)
MCHC: 33.3 g/dL (ref 30.0–36.0)
MCV: 82.8 fl (ref 78.0–100.0)
Monocytes Absolute: 0.4 K/uL (ref 0.1–1.0)
Monocytes Relative: 7 % (ref 3.0–12.0)
Neutro Abs: 3.5 K/uL (ref 1.4–7.7)
Neutrophils Relative %: 56.9 % (ref 43.0–77.0)
Platelets: 290 K/uL (ref 150.0–400.0)
RBC: 4.83 Mil/uL (ref 3.87–5.11)
RDW: 13.4 % (ref 11.5–15.5)
WBC: 6.2 K/uL (ref 4.0–10.5)

## 2024-06-14 LAB — COMPREHENSIVE METABOLIC PANEL WITH GFR
ALT: 17 U/L (ref 0–35)
AST: 19 U/L (ref 0–37)
Albumin: 4.1 g/dL (ref 3.5–5.2)
Alkaline Phosphatase: 100 U/L (ref 39–117)
BUN: 11 mg/dL (ref 6–23)
CO2: 27 meq/L (ref 19–32)
Calcium: 9 mg/dL (ref 8.4–10.5)
Chloride: 106 meq/L (ref 96–112)
Creatinine, Ser: 0.87 mg/dL (ref 0.40–1.20)
GFR: 79.86 mL/min (ref >60.00)
Glucose, Bld: 87 mg/dL (ref 70–99)
Potassium: 3.7 meq/L (ref 3.5–5.1)
Sodium: 142 meq/L (ref 135–145)
Total Bilirubin: 0.5 mg/dL (ref 0.2–1.2)
Total Protein: 6.4 g/dL (ref 6.0–8.3)

## 2024-06-14 LAB — LIPID PANEL
Cholesterol: 187 mg/dL (ref 0–200)
HDL: 38.2 mg/dL — ABNORMAL LOW (ref >39.00)
LDL Cholesterol: 118 mg/dL — ABNORMAL HIGH (ref 0–99)
NonHDL: 148.41
Total CHOL/HDL Ratio: 5
Triglycerides: 152 mg/dL — ABNORMAL HIGH (ref 0.0–149.0)
VLDL: 30.4 mg/dL (ref 0.0–40.0)

## 2024-06-14 LAB — TSH: TSH: 1.82 u[IU]/mL (ref 0.35–5.50)

## 2024-06-14 MED ORDER — FLUOXETINE HCL 40 MG PO CAPS
40.0000 mg | ORAL_CAPSULE | Freq: Every day | ORAL | 3 refills | Status: AC
  Filled 2024-06-14 – 2024-09-03 (×2): qty 90, 90d supply, fill #0

## 2024-06-14 NOTE — Progress Notes (Unsigned)
 Assessment & Plan:  Routine general medical examination at a health care facility Assessment & Plan: Clinical breast exam performed.  Deferred pelvic exam as patient is on her menses.  Patient will return for Pap smear.  Encouraged continued walking.  Orders: -     Lipid panel -     Comprehensive metabolic panel with GFR -     CBC with Differential/Platelet  Adjustment disorder with mixed anxiety and depressed mood Assessment & Plan: Chronic, stable.  Continue Prozac  40 mg daily  Orders: -     FLUoxetine  HCl; Take 1 capsule (40 mg total) by mouth daily.  Dispense: 90 capsule; Refill: 3  Dysmenorrhea Assessment & Plan: Chronic, episodic. Reviewed prior transvaginal US .  She declines further evaluation aside from labs as not particularly bothered by symptom at this time. Counseled on alarm features in regards to pelvic pain, clots in which to let me know.   Orders: -     Comprehensive metabolic panel with GFR -     CBC with Differential/Platelet -     TSH -     FSH/LH -     Insulin , random -     Prolactin  Encounter for screening mammogram for malignant neoplasm of breast -     3D Screening Mammogram, Left and Right; Future     Return precautions given.   Risks, benefits, and alternatives of the medications and treatment plan prescribed today were discussed, and patient expressed understanding.   Education regarding symptom management and diagnosis given to patient on AVS either electronically or printed.  No follow-ups on file.  Jamie Northern, FNP  Subjective:    Patient ID: Jamie Gordon, female    DOB: Jul 28, 1976, 48 y.o.   MRN: [id number]  CC: Jamie Gordon is a 48 y.o. female who presents today for physical exam.    HPI: Complains of irregular menses in which she misses a menses for a one month intermittently. Symptom started 9 months ago.  She misses every couple of months. She has had a normal 28 day cycle these past two months consecutively.  No  large clots, pelvic pain.   No concern STDs.     She is doing well on Prozac  40 mg daily. 9/15/2 US  transvaginal US  normal  Colorectal Cancer Screening: UTD , 07/2023 dr unk repeat in 7 years Breast Cancer Screening: Mammogram UTD Cervical Cancer Screening: due; she is on her menses; pap obtained 11/08/2020 negative malignancy, negative HPV         Tetanus - UTD         Exercise: Gets regular exercise with walking   Alcohol use:  none Smoking/tobacco use: Nonsmoker.    Health Maintenance  Topic Date Due   Hepatitis B Vaccine (1 of 3 - 19+ 3-dose series) Never done   COVID-19 Vaccine (4 - 2025-26 season) 06/30/2024*   Flu Shot  12/19/2024*   Breast Cancer Screening  07/15/2025   Pap with HPV screening  11/08/2025   Colon Cancer Screening  07/29/2030   DTaP/Tdap/Td vaccine (3 - Td or Tdap) 05/18/2032   Hepatitis C Screening  Completed   Pneumococcal Vaccine  Aged Out   HPV Vaccine  Aged Out   Meningitis B Vaccine  Aged Out   HIV Screening  Discontinued  *Topic was postponed. The date shown is not the original due date.    ALLERGIES: Latex  Current Outpatient Medications on File Prior to Visit  Medication Sig Dispense Refill   esomeprazole  (NEXIUM ) 20  MG capsule Take 1 capsule (20 mg total) by mouth daily as needed (acid reflux symptoms). 84 capsule 1   fexofenadine -pseudoephedrine  (ALLEGRA-D ALLERGY & CONGESTION) 180-240 MG 24 hr tablet Take 1 tablet by mouth daily as needed (limit to no more than 7 days). 30 tablet 2   No current facility-administered medications on file prior to visit.    Review of Systems  Constitutional:  Negative for chills and fever.  Respiratory:  Negative for cough.   Cardiovascular:  Negative for chest pain and palpitations.  Gastrointestinal:  Negative for nausea and vomiting.  Genitourinary:  Positive for menstrual problem and vaginal bleeding. Negative for pelvic pain.      Objective:    BP 120/82   Pulse 80   Temp 98.7 F (37.1  C) (Oral)   Ht 5' 4 (1.626 m)   Wt 219 lb 3.2 oz (99.4 kg)   LMP 06/10/2024 (Exact Date)   SpO2 98%   BMI 37.63 kg/m   BP Readings from Last 3 Encounters:  06/14/24 120/82  07/30/23 (!) 117/98  06/04/23 122/80   Wt Readings from Last 3 Encounters:  06/14/24 219 lb 3.2 oz (99.4 kg)  07/30/23 205 lb 6.8 oz (93.2 kg)  06/04/23 213 lb 1.6 oz (96.7 kg)      06/14/2024   12:10 PM 06/04/2023   10:53 AM 05/18/2022    9:29 AM  Depression screen PHQ 2/9  Decreased Interest 0 0 0  Down, Depressed, Hopeless 0 0 0  PHQ - 2 Score 0 0 0  Altered sleeping 0 0   Tired, decreased energy 0 0   Change in appetite 0 0   Feeling bad or failure about yourself  0 0   Trouble concentrating 0 0   Moving slowly or fidgety/restless 0 0   Suicidal thoughts 0 0   PHQ-9 Score 0 0   Difficult doing work/chores Not difficult at all Not difficult at all      Physical Exam Vitals reviewed.  Constitutional:      Appearance: Normal appearance. She is well-developed.  Eyes:     Conjunctiva/sclera: Conjunctivae normal.  Neck:     Thyroid: No thyroid mass or thyromegaly.  Cardiovascular:     Rate and Rhythm: Normal rate and regular rhythm.     Pulses: Normal pulses.     Heart sounds: Normal heart sounds.  Pulmonary:     Effort: Pulmonary effort is normal.     Breath sounds: Normal breath sounds. No wheezing, rhonchi or rales.  Chest:  Breasts:    Breasts are symmetrical.     Right: No inverted nipple, mass, nipple discharge, skin change or tenderness.     Left: No inverted nipple, mass, nipple discharge, skin change or tenderness.  Abdominal:     General: Bowel sounds are normal. There is no distension.     Palpations: Abdomen is soft. Abdomen is not rigid. There is no fluid wave or mass.     Tenderness: There is no abdominal tenderness. There is no guarding or rebound.  Lymphadenopathy:     Head:     Right side of head: No submental, submandibular, tonsillar, preauricular, posterior  auricular or occipital adenopathy.     Left side of head: No submental, submandibular, tonsillar, preauricular, posterior auricular or occipital adenopathy.     Cervical: No cervical adenopathy.     Right cervical: No superficial, deep or posterior cervical adenopathy.    Left cervical: No superficial, deep or posterior cervical adenopathy.  Skin:  General: Skin is warm and dry.  Neurological:     Mental Status: She is alert.  Psychiatric:        Speech: Speech normal.        Behavior: Behavior normal.        Thought Content: Thought content normal.

## 2024-06-14 NOTE — Assessment & Plan Note (Signed)
 Chronic, episodic. Reviewed prior transvaginal US .  She declines further evaluation aside from labs as not particularly bothered by symptom at this time. Counseled on alarm features in regards to pelvic pain, clots in which to let me know.

## 2024-06-15 ENCOUNTER — Ambulatory Visit: Payer: Self-pay | Admitting: Family

## 2024-06-15 LAB — INSULIN, RANDOM: Insulin: 8 u[IU]/mL

## 2024-06-15 LAB — FSH/LH
FSH: 20.8 m[IU]/mL
LH: 8 m[IU]/mL

## 2024-06-15 LAB — PROLACTIN: Prolactin: 15.7 ng/mL

## 2024-06-16 NOTE — Assessment & Plan Note (Signed)
 Clinical breast exam performed.  Deferred pelvic exam as patient is on her menses.  Patient will return for Pap smear.  Encouraged continued walking.

## 2024-06-16 NOTE — Assessment & Plan Note (Signed)
 Chronic, stable  Continue Prozac  40mg  daily

## 2024-06-16 NOTE — Patient Instructions (Signed)

## 2024-07-28 ENCOUNTER — Ambulatory Visit: Admitting: Family

## 2024-07-28 ENCOUNTER — Ambulatory Visit
Admission: RE | Admit: 2024-07-28 | Discharge: 2024-07-28 | Disposition: A | Discharge status: Home / Self Care | Source: Ambulatory Visit | Attending: Family | Admitting: Family

## 2024-07-28 DIAGNOSIS — Z1231 Encounter for screening mammogram for malignant neoplasm of breast: Secondary | ICD-10-CM | POA: Diagnosis not present

## 2024-08-10 ENCOUNTER — Other Ambulatory Visit (HOSPITAL_COMMUNITY): Payer: Self-pay

## 2024-09-03 ENCOUNTER — Other Ambulatory Visit (HOSPITAL_COMMUNITY): Payer: Self-pay

## 2024-12-14 ENCOUNTER — Ambulatory Visit: Admitting: Dermatology

## 2025-06-15 ENCOUNTER — Encounter: Admitting: Family
